# Patient Record
Sex: Male | Born: 1965 | Race: Black or African American | Hispanic: No | Marital: Married | Smoking: Never smoker
Health system: Southern US, Community
[De-identification: ages and names within clinical notes are randomized; demographics above are authoritative.]

## PROBLEM LIST (undated history)

## (undated) DIAGNOSIS — I10 Essential (primary) hypertension: Secondary | ICD-10-CM

## (undated) DIAGNOSIS — Z889 Allergy status to unspecified drugs, medicaments and biological substances status: Secondary | ICD-10-CM

## (undated) HISTORY — PX: ADENOIDECTOMY: SUR15

## (undated) HISTORY — PX: TONSILLECTOMY: SUR1361

## (undated) HISTORY — PX: HERNIA REPAIR: SHX51

---

## 1998-08-06 ENCOUNTER — Encounter: Payer: Self-pay | Admitting: Emergency Medicine

## 1998-08-06 ENCOUNTER — Emergency Department (HOSPITAL_COMMUNITY): Admission: EM | Admit: 1998-08-06 | Discharge: 1998-08-06 | Payer: Self-pay | Admitting: Emergency Medicine

## 2010-02-08 ENCOUNTER — Encounter: Admission: RE | Admit: 2010-02-08 | Discharge: 2010-02-08 | Payer: Self-pay | Admitting: Internal Medicine

## 2014-12-06 ENCOUNTER — Other Ambulatory Visit: Payer: Self-pay | Admitting: Internal Medicine

## 2014-12-06 ENCOUNTER — Ambulatory Visit
Admission: RE | Admit: 2014-12-06 | Discharge: 2014-12-06 | Disposition: A | Discharge status: Home / Self Care | Payer: 59 | Source: Ambulatory Visit | Attending: Internal Medicine | Admitting: Internal Medicine

## 2014-12-06 DIAGNOSIS — M549 Dorsalgia, unspecified: Secondary | ICD-10-CM

## 2014-12-28 ENCOUNTER — Encounter (HOSPITAL_COMMUNITY): Payer: Self-pay | Admitting: *Deleted

## 2014-12-28 ENCOUNTER — Other Ambulatory Visit: Payer: Self-pay | Admitting: Gastroenterology

## 2015-01-01 NOTE — Anesthesia Preprocedure Evaluation (Addendum)
Anesthesia Evaluation  Patient identified by MRN, date of birth, ID band Patient awake    Reviewed: Allergy & Precautions, NPO status , Patient's Chart, lab work & pertinent test results, reviewed documented beta blocker date and time   Airway Mallampati: II   Neck ROM: Full    Dental  (+) Teeth Intact, Dental Advisory Given   Pulmonary neg pulmonary ROS,  breath sounds clear to auscultation        Cardiovascular hypertension, Pt. on medications Rhythm:Regular     Neuro/Psych negative neurological ROS     GI/Hepatic Neg liver ROS,   Endo/Other  negative endocrine ROS  Renal/GU      Musculoskeletal   Abdominal (+)  Abdomen: soft.    Peds  Hematology negative hematology ROS (+)   Anesthesia Other Findings   Reproductive/Obstetrics                            Anesthesia Physical Anesthesia Plan  ASA: II  Anesthesia Plan: MAC   Post-op Pain Management:    Induction: Intravenous  Airway Management Planned:   Additional Equipment:   Intra-op Plan:   Post-operative Plan:   Informed Consent: I have reviewed the patients History and Physical, chart, labs and discussed the procedure including the risks, benefits and alternatives for the proposed anesthesia with the patient or authorized representative who has indicated his/her understanding and acceptance.     Plan Discussed with:   Anesthesia Plan Comments:         Anesthesia Quick Evaluation

## 2015-01-03 ENCOUNTER — Ambulatory Visit (HOSPITAL_COMMUNITY): Payer: 59 | Admitting: Anesthesiology

## 2015-01-03 ENCOUNTER — Encounter (HOSPITAL_COMMUNITY)
Admission: RE | Disposition: A | Discharge status: Home / Self Care | Payer: 59 | Source: Ambulatory Visit | Attending: Gastroenterology

## 2015-01-03 ENCOUNTER — Ambulatory Visit (HOSPITAL_COMMUNITY)
Admission: RE | Admit: 2015-01-03 | Discharge: 2015-01-03 | Disposition: A | Discharge status: Home / Self Care | Payer: 59 | Source: Ambulatory Visit | Attending: Gastroenterology | Admitting: Gastroenterology

## 2015-01-03 ENCOUNTER — Encounter (HOSPITAL_COMMUNITY): Payer: Self-pay | Admitting: Gastroenterology

## 2015-01-03 DIAGNOSIS — I7300 Raynaud's syndrome without gangrene: Secondary | ICD-10-CM | POA: Insufficient documentation

## 2015-01-03 DIAGNOSIS — Z1211 Encounter for screening for malignant neoplasm of colon: Secondary | ICD-10-CM | POA: Diagnosis not present

## 2015-01-03 DIAGNOSIS — K5730 Diverticulosis of large intestine without perforation or abscess without bleeding: Secondary | ICD-10-CM | POA: Diagnosis not present

## 2015-01-03 DIAGNOSIS — I10 Essential (primary) hypertension: Secondary | ICD-10-CM | POA: Diagnosis not present

## 2015-01-03 DIAGNOSIS — Z880 Allergy status to penicillin: Secondary | ICD-10-CM | POA: Insufficient documentation

## 2015-01-03 DIAGNOSIS — Z888 Allergy status to other drugs, medicaments and biological substances status: Secondary | ICD-10-CM | POA: Insufficient documentation

## 2015-01-03 DIAGNOSIS — M1990 Unspecified osteoarthritis, unspecified site: Secondary | ICD-10-CM | POA: Insufficient documentation

## 2015-01-03 HISTORY — DX: Allergy status to unspecified drugs, medicaments and biological substances: Z88.9

## 2015-01-03 HISTORY — PX: COLONOSCOPY WITH PROPOFOL: SHX5780

## 2015-01-03 HISTORY — DX: Essential (primary) hypertension: I10

## 2015-01-03 SURGERY — COLONOSCOPY WITH PROPOFOL
Anesthesia: Monitor Anesthesia Care

## 2015-01-03 MED ORDER — PROPOFOL INFUSION 10 MG/ML OPTIME
INTRAVENOUS | Status: DC | PRN
  Administered 2015-01-03: 120 ug/kg/min via INTRAVENOUS

## 2015-01-03 MED ORDER — LACTATED RINGERS IV SOLN
INTRAVENOUS | Status: DC
  Administered 2015-01-03: 1000 mL via INTRAVENOUS

## 2015-01-03 MED ORDER — PROPOFOL 10 MG/ML IV BOLUS
INTRAVENOUS | Status: DC | PRN
  Administered 2015-01-03: 50 mg via INTRAVENOUS
  Administered 2015-01-03 (×3): 20 mg via INTRAVENOUS

## 2015-01-03 MED ORDER — PROPOFOL INFUSION 10 MG/ML OPTIME: INTRAVENOUS | Status: DC | PRN

## 2015-01-03 MED ORDER — PROPOFOL 10 MG/ML IV BOLUS
INTRAVENOUS | Status: AC
  Filled 2015-01-03: qty 20

## 2015-01-03 MED ORDER — PROPOFOL 10 MG/ML IV BOLUS: INTRAVENOUS | Status: DC | PRN

## 2015-01-03 MED ORDER — PROMETHAZINE HCL 25 MG/ML IJ SOLN: 6.2500 mg | INTRAMUSCULAR | Status: DC | PRN

## 2015-01-03 MED ORDER — SODIUM CHLORIDE 0.9 % IV SOLN: INTRAVENOUS | Status: DC

## 2015-01-03 MED ORDER — MEPERIDINE HCL 100 MG/ML IJ SOLN: 6.2500 mg | INTRAMUSCULAR | Status: DC | PRN

## 2015-01-03 SURGICAL SUPPLY — 22 items

## 2015-01-03 NOTE — Discharge Instructions (Signed)
Colonoscopy, Care After °Refer to this sheet in the next few weeks. These instructions provide you with information on caring for yourself after your procedure. Your health care provider may also give you more specific instructions. Your treatment has been planned according to current medical practices, but problems sometimes occur. Call your health care provider if you have any problems or questions after your procedure. °WHAT TO EXPECT AFTER THE PROCEDURE  °After your procedure, it is typical to have the following: °· A small amount of blood in your stool. °· Moderate amounts of gas and mild abdominal cramping or bloating. °HOME CARE INSTRUCTIONS °· Do not drive, operate machinery, or sign important documents for 24 hours. °· You may shower and resume your regular physical activities, but move at a slower pace for the first 24 hours. °· Take frequent rest periods for the first 24 hours. °· Walk around or put a warm pack on your abdomen to help reduce abdominal cramping and bloating. °· Drink enough fluids to keep your urine clear or pale yellow. °· You may resume your normal diet as instructed by your health care provider. Avoid heavy or fried foods that are hard to digest. °· Avoid drinking alcohol for 24 hours or as instructed by your health care provider. °· Only take over-the-counter or prescription medicines as directed by your health care provider. °· If a tissue sample (biopsy) was taken during your procedure: °¨ Do not take aspirin or blood thinners for 7 days, or as instructed by your health care provider. °¨ Do not drink alcohol for 7 days, or as instructed by your health care provider. °¨ Eat soft foods for the first 24 hours. °SEEK MEDICAL CARE IF: °You have persistent spotting of blood in your stool 2-3 days after the procedure. °SEEK IMMEDIATE MEDICAL CARE IF: °· You have more than a small spotting of blood in your stool. °· You pass large blood clots in your stool. °· Your abdomen is swollen  (distended). °· You have nausea or vomiting. °· You have a fever. °· You have increasing abdominal pain that is not relieved with medicine. °Document Released: 03/13/2004 Document Revised: 05/20/2013 Document Reviewed: 04/06/2013 °[employer]® Patient Information ©2015 [employer]. This information is not intended to replace advice given to you by your health care provider. Make sure you discuss any questions you have with your health care provider. ° °Conscious Sedation, Adult, Care After °Refer to this sheet in the next few weeks. These instructions provide you with information on caring for yourself after your procedure. Your health care provider may also give you more specific instructions. Your treatment has been planned according to current medical practices, but problems sometimes occur. Call your health care provider if you have any problems or questions after your procedure. °WHAT TO EXPECT AFTER THE PROCEDURE  °After your procedure: °· You may feel sleepy, clumsy, and have poor balance for several hours. °· Vomiting may occur if you eat too soon after the procedure. °HOME CARE INSTRUCTIONS °· Do not participate in any activities where you could become injured for at least 24 hours. Do not: °¨ Drive. °¨ Swim. °¨ Ride a bicycle. °¨ Operate heavy machinery. °¨ Cook. °¨ Use power tools. °¨ Climb ladders. °¨ Work from a high place. °· Do not make important decisions or sign legal documents until you are improved. °· If you vomit, drink water, juice, or soup when you can drink without vomiting. Make sure you have little or no nausea before eating solid foods. °·   Only take over-the-counter or prescription medicines for pain, discomfort, or fever as directed by your health care provider. °· Make sure you and your family fully understand everything about the medicines given to you, including what side effects may occur. °· You should not drink alcohol, take sleeping pills, or take medicines that cause drowsiness for  at least 24 hours. °· If you smoke, do not smoke without supervision. °· If you are feeling better, you may resume normal activities 24 hours after you were sedated. °· Keep all appointments with your health care provider. °SEEK MEDICAL CARE IF: °· Your skin is pale or bluish in color. °· You continue to feel nauseous or vomit. °· Your pain is getting worse and is not helped by medicine. °· You have bleeding or swelling. °· You are still sleepy or feeling clumsy after 24 hours. °SEEK IMMEDIATE MEDICAL CARE IF: °· You develop a rash. °· You have difficulty breathing. °· You develop any type of allergic problem. °· You have a fever. °MAKE SURE YOU: °· Understand these instructions. °· Will watch your condition. °· Will get help right away if you are not doing well or get worse. °Document Released: 05/20/2013 Document Reviewed: 05/20/2013 °Exit[employer]® Patient Information ©2015 [employer]. This information is not intended to replace advice given to you by your health care provider. Make sure you discuss any questions you have with your health care provider. ° ° °

## 2015-01-03 NOTE — Transfer of Care (Signed)
Immediate Anesthesia Transfer of Care Note  Patient: Steven Benson  Procedure(s) Performed: Procedure(s): COLONOSCOPY WITH PROPOFOL (N/A)  Patient Location: PACU  Anesthesia Type:MAC  Level of Consciousness:  sedated, patient cooperative and responds to stimulation  Airway & Oxygen Therapy:Patient Spontanous Breathing and Patient connected to face mask oxgen  Post-op Assessment:  Report given to PACU RN and Post -op Vital signs reviewed and stable  Post vital signs:  Reviewed and stable  Last Vitals:  Filed Vitals:   01/03/15 1239  BP: 131/76  Pulse: 44  Temp: 36.5 C  Resp: 19    Complications: No apparent anesthesia complications

## 2015-01-03 NOTE — Anesthesia Postprocedure Evaluation (Signed)
  Anesthesia Post-op Note  Patient: Steven Benson  Procedure(s) Performed: Procedure(s): COLONOSCOPY WITH PROPOFOL (N/A)  Patient Location: PACU  Anesthesia Type:MAC  Level of Consciousness: awake  Airway and Oxygen Therapy: Patient Spontanous Breathing  Post-op Pain: none  Post-op Assessment: Post-op Vital signs reviewed, Patient's Cardiovascular Status Stable, Respiratory Function Stable, Patent Airway and No signs of Nausea or vomiting  Post-op Vital Signs: Reviewed and stable  Last Vitals:  Filed Vitals:   01/03/15 1442  BP: 114/62  Pulse: 46  Temp: 36.4 C  Resp: 15    Complications: No apparent anesthesia complications

## 2015-01-03 NOTE — Op Note (Signed)
Procedure: Baseline screening colonoscopy  Endoscopist: Danise EdgeMartin Johnson  Premedication: Propofol administered by anesthesia  Procedure: The patient was placed in the left lateral decubitus position. Anal inspection and digital rectal exam were normal. The Pentax pediatric colonoscope was introduced into the rectum and advanced to the cecum. A normal-appearing appendiceal orifice and ileocecal valve were identified. Colonic preparation for the exam today was good. Withdrawal time was 9 minutes  Rectum. Normal. Retroflexed view of the distal rectum was normal  Sigmoid colon and descending colon. Left colonic diverticulosis  Splenic flexure. Normal  Transverse colon. Normal  Hepatic flexure. Normal.  Ascending colon. Normal  Cecum and ileocecal valve. Normal  Assessment: Normal screening colonoscopy  Recommendation: Schedule repeat screening colonoscopy in 10 years

## 2015-01-03 NOTE — H&P (Signed)
  Procedure: Baseline screening colonoscopy. No family history of colon cancer  History: The patient is a 49 year old male born 1965-10-02. He is scheduled to undergo his first screening colonoscopy with polypectomy to prevent colon cancer.  Past medical history: Hypertension. Allergic rhinitis. Osteoarthritis. Raynaud's disease. Left inguinal herniorrhaphy surgery. Tonsillectomy. Left toe surgery.  Medication allergies: Penicillin. ACE inhibitors. Quinapril.  Exam: The patient is alert and lying comfortably on the endoscopy stretcher. Abdomen is soft and nontender to palpation. Lungs are clear to auscultation. Cardiac exam reveals a regular rhythm.  Plan: Proceed with screening colonoscopy

## 2015-01-04 ENCOUNTER — Encounter (HOSPITAL_COMMUNITY): Payer: Self-pay | Admitting: Gastroenterology

## 2016-07-08 IMAGING — CR DG CHEST 2V
2 series · 2 of 2 positions shown · non-contrast
Comparison: None.

CLINICAL DATA: Mid back pain for 1 month, no smoking history

EXAM:
CHEST  2 VIEW

[w chest pa]
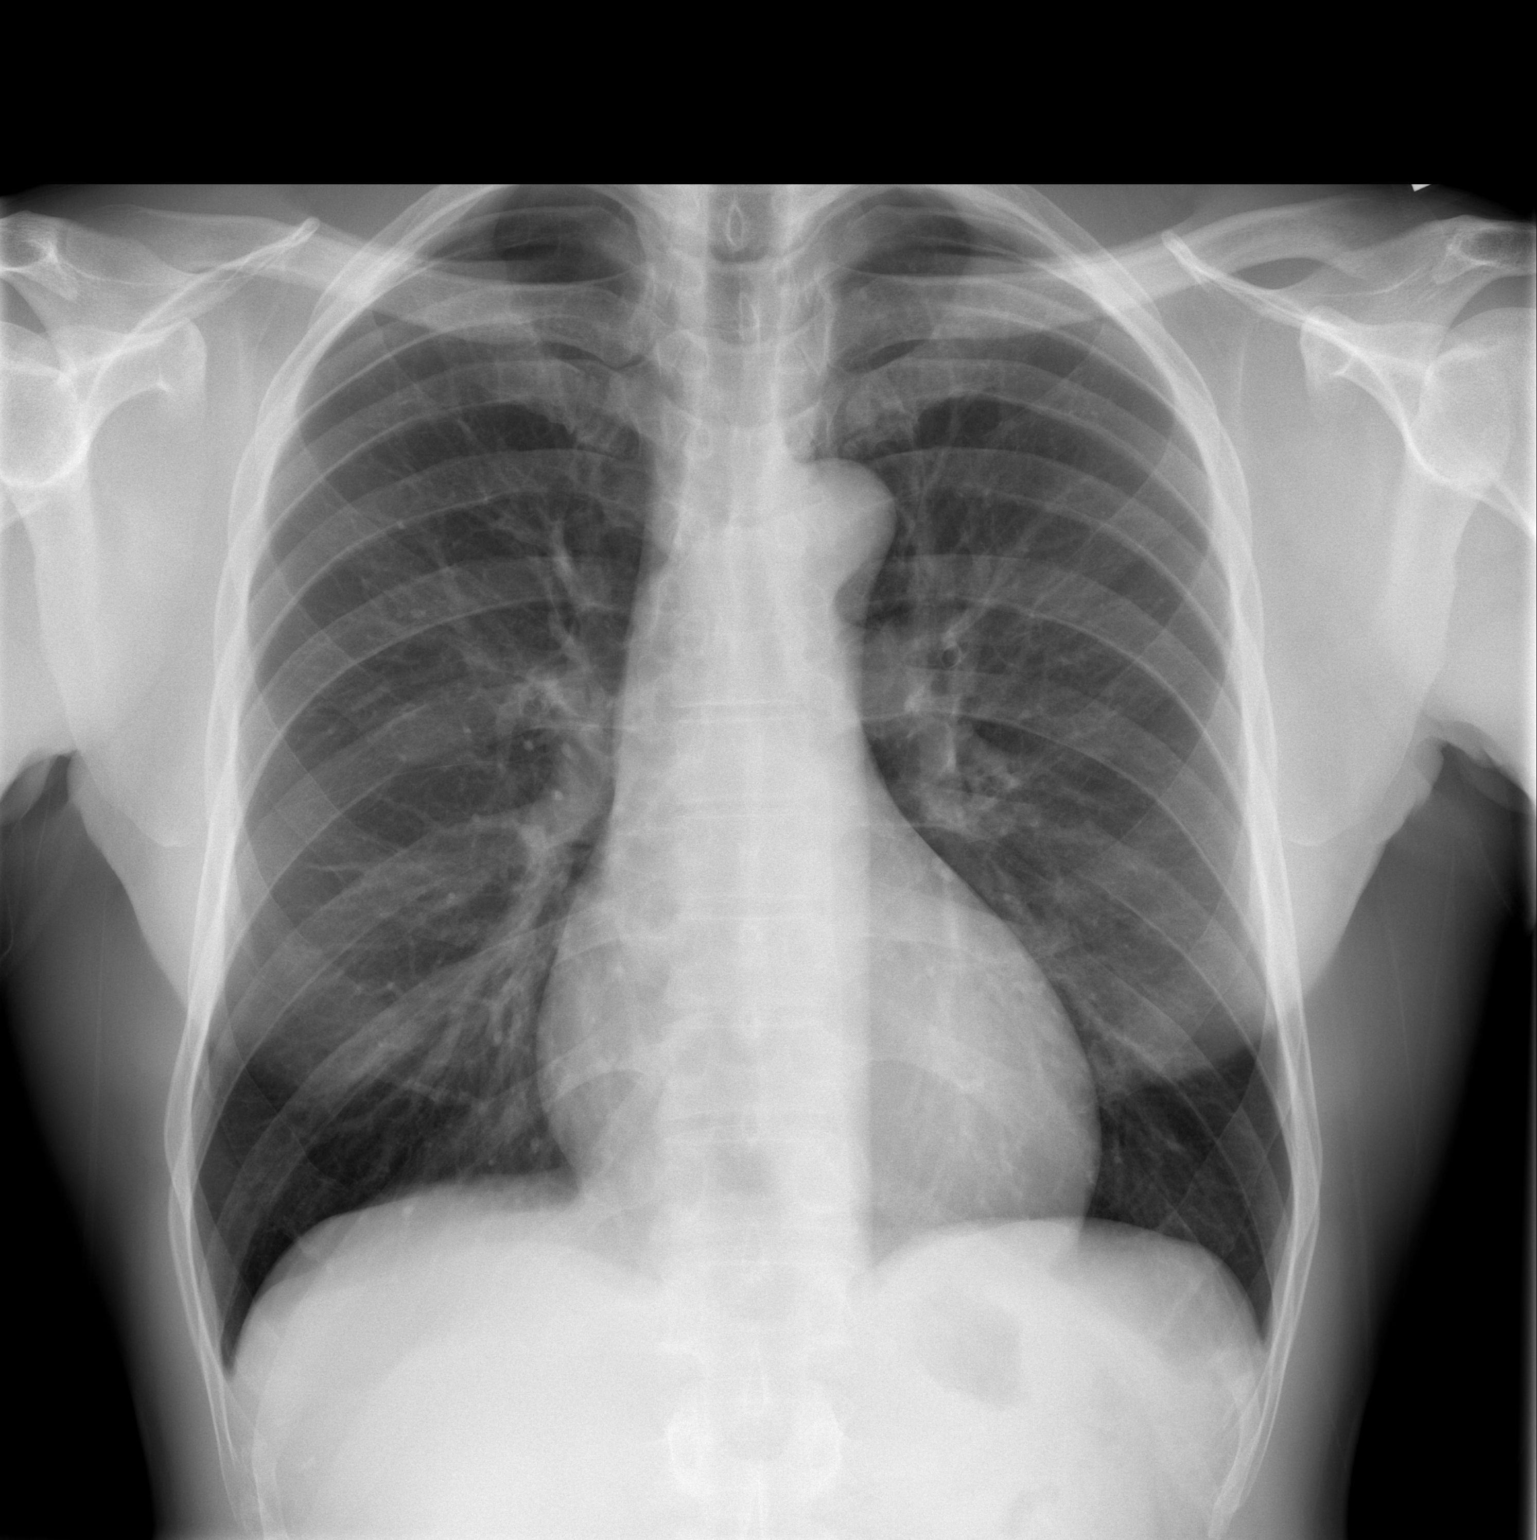

[w chest lat]
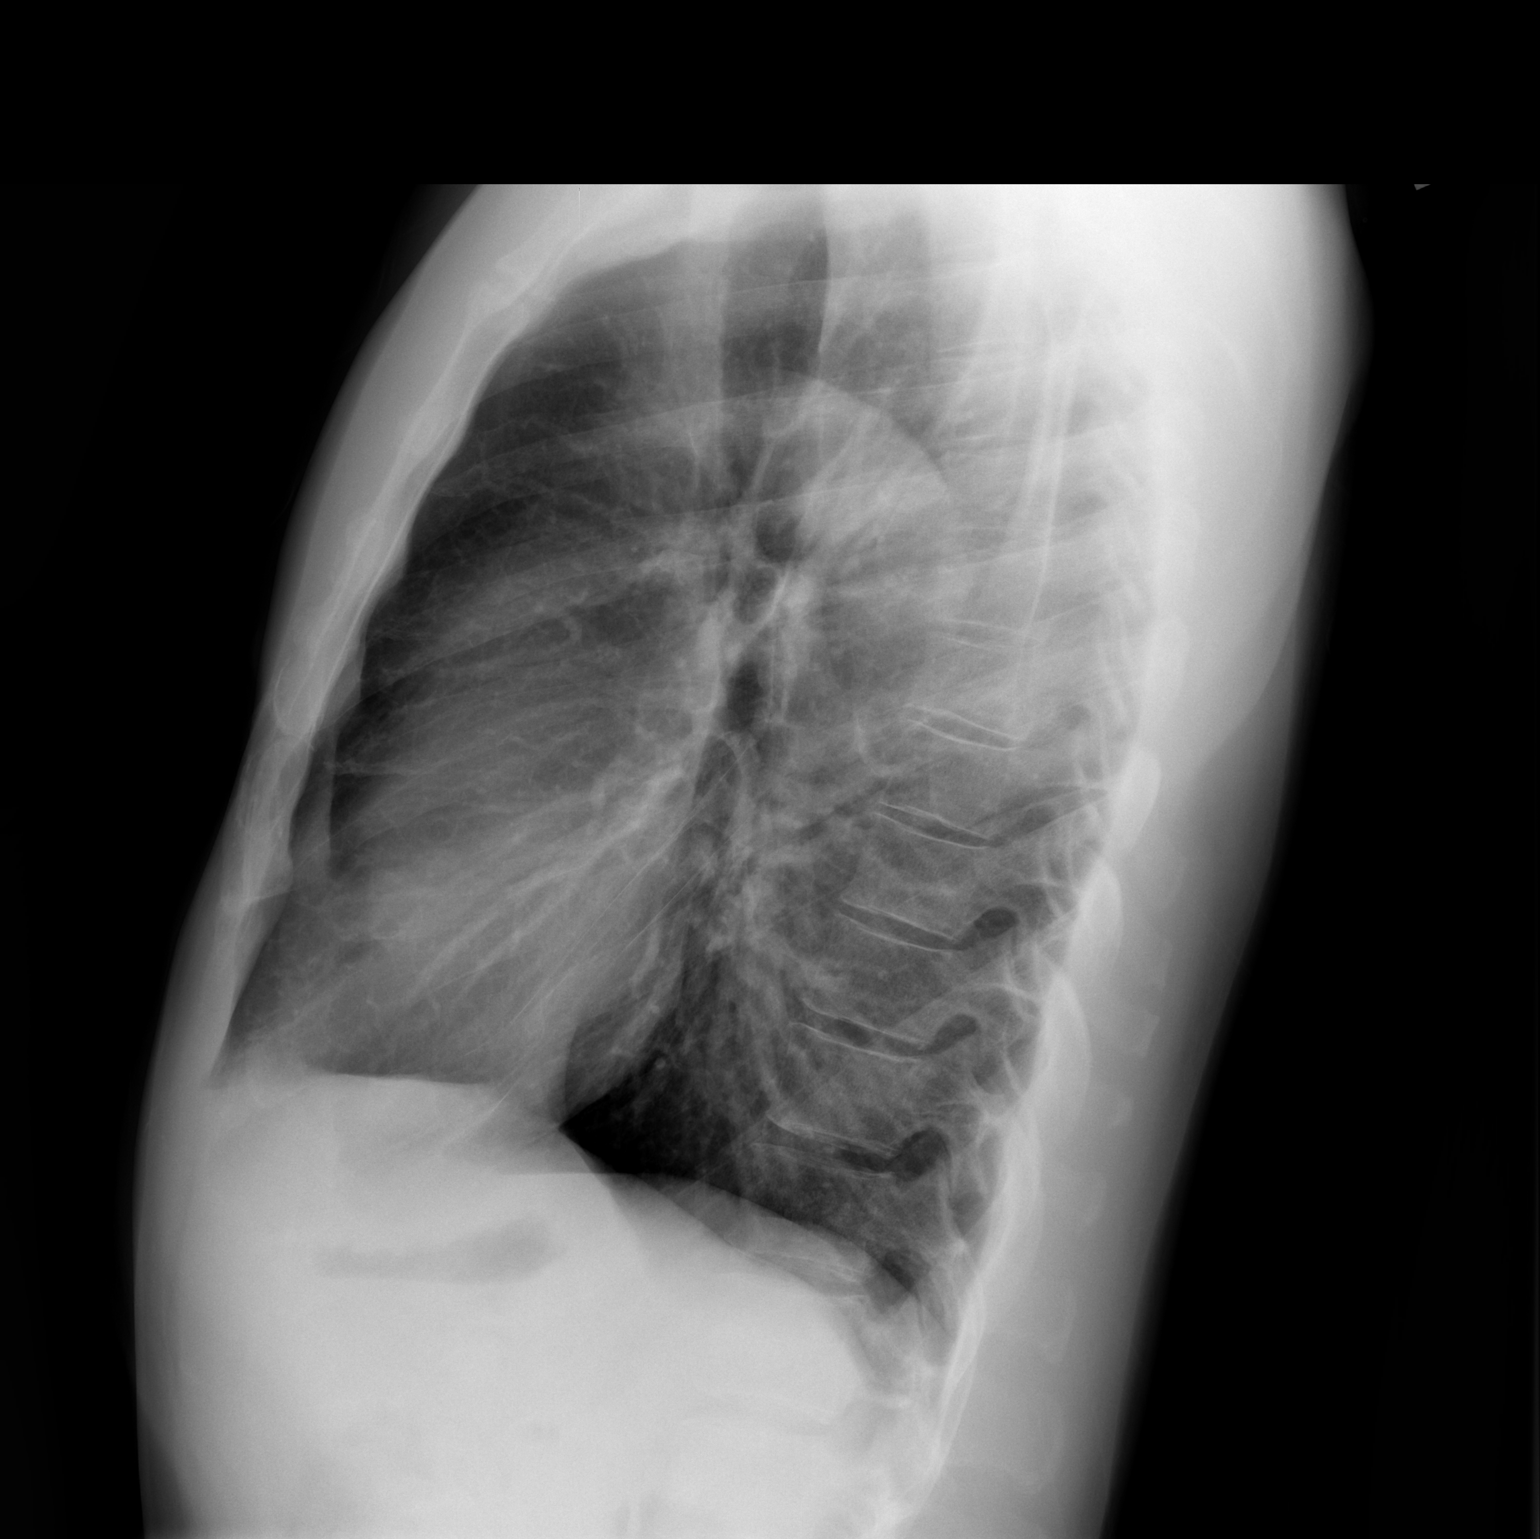

[2 of 2 positions shown; findings below may reference images not displayed]

FINDINGS: The heart size and mediastinal contours are within normal limits.
Both lungs are clear. The visualized skeletal structures are
unremarkable.
IMPRESSION: No active cardiopulmonary disease.

## 2016-10-29 ENCOUNTER — Emergency Department (HOSPITAL_COMMUNITY)
Admission: EM | Admit: 2016-10-29 | Discharge: 2016-10-29 | Disposition: A | Discharge status: Home / Self Care | Payer: 59 | Attending: Emergency Medicine | Admitting: Emergency Medicine

## 2016-10-29 ENCOUNTER — Encounter (HOSPITAL_COMMUNITY): Payer: Self-pay | Admitting: Emergency Medicine

## 2016-10-29 DIAGNOSIS — R42 Dizziness and giddiness: Secondary | ICD-10-CM | POA: Diagnosis present

## 2016-10-29 DIAGNOSIS — R531 Weakness: Secondary | ICD-10-CM | POA: Diagnosis not present

## 2016-10-29 DIAGNOSIS — Z79899 Other long term (current) drug therapy: Secondary | ICD-10-CM | POA: Diagnosis not present

## 2016-10-29 DIAGNOSIS — I10 Essential (primary) hypertension: Secondary | ICD-10-CM | POA: Insufficient documentation

## 2016-10-29 DIAGNOSIS — H81392 Other peripheral vertigo, left ear: Secondary | ICD-10-CM | POA: Diagnosis not present

## 2016-10-29 DIAGNOSIS — Z91040 Latex allergy status: Secondary | ICD-10-CM | POA: Insufficient documentation

## 2016-10-29 DIAGNOSIS — H6122 Impacted cerumen, left ear: Secondary | ICD-10-CM | POA: Diagnosis not present

## 2016-10-29 DIAGNOSIS — R404 Transient alteration of awareness: Secondary | ICD-10-CM | POA: Diagnosis not present

## 2016-10-29 MED ORDER — DOCUSATE SODIUM 50 MG/5ML PO LIQD
50.0000 mg | Freq: Once | ORAL | Status: AC
  Administered 2016-10-29: 50 mg via OTIC
  Filled 2016-10-29: qty 10

## 2016-10-29 MED ORDER — SODIUM CHLORIDE 0.9 % IV BOLUS (SEPSIS)
1000.0000 mL | Freq: Once | INTRAVENOUS | Status: AC
  Administered 2016-10-29: 1000 mL via INTRAVENOUS

## 2016-10-29 MED ORDER — MECLIZINE HCL 25 MG PO TABS
25.0000 mg | ORAL_TABLET | Freq: Once | ORAL | Status: AC
  Administered 2016-10-29: 25 mg via ORAL
  Filled 2016-10-29: qty 1

## 2016-10-29 MED ORDER — DIPHENHYDRAMINE HCL 50 MG/ML IJ SOLN
25.0000 mg | Freq: Once | INTRAMUSCULAR | Status: AC
  Administered 2016-10-29: 25 mg via INTRAVENOUS
  Filled 2016-10-29: qty 1

## 2016-10-29 MED ORDER — PROCHLORPERAZINE EDISYLATE 5 MG/ML IJ SOLN
10.0000 mg | Freq: Once | INTRAMUSCULAR | Status: AC
  Administered 2016-10-29: 10 mg via INTRAVENOUS
  Filled 2016-10-29: qty 2

## 2016-10-29 MED ORDER — MECLIZINE HCL 25 MG PO TABS
25.0000 mg | ORAL_TABLET | Freq: Three times a day (TID) | ORAL | 0 refills | Status: DC | PRN
Start: 2016-10-29 — End: 2020-10-11

## 2016-10-29 NOTE — ED Notes (Signed)
Large amount of wax irrigated from left ear. PT tolerated well. PT tolerates sitting up in bed and reports nausea has improved. PT continues to report dizziness, but it has also improved.

## 2016-10-29 NOTE — ED Notes (Signed)
ED Provider at bedside. 

## 2016-10-29 NOTE — ED Notes (Signed)
PT ambulates to bathroom while holding wall rail. PT's wife reports this is an improvement as PT was not able to stand at home.

## 2016-10-29 NOTE — ED Provider Notes (Signed)
[facility]-EMERGENCY DEPT Provider Note   CSN: [id number] Arrival date & time: 10/29/16  2012     History   Chief Complaint Chief Complaint  Patient presents with  . Dizziness  . Near Syncope    HPI Steven Benson is a 51 y.o. male.  51 yo M with a chief complaint of vertigo. This been going on since morning. Worsening with head movement right movement. Patient got so bad today that he started throwing up. Denies chest pain shortness breath denies headaches. At some diaphoresis with his vomiting. Denies abdominal pain denies fevers   The history is provided by the patient.  Dizziness  Quality:  Head spinning Severity:  Severe Onset quality:  Sudden Duration:  1 day Timing:  Constant Progression:  Unchanged Chronicity:  New Context: eye movement and head movement   Relieved by:  Being still and closing eyes Worsened by:  Eye movement and turning head Ineffective treatments:  None tried Associated symptoms: no chest pain, no diarrhea, no headaches, no palpitations, no shortness of breath and no vomiting   Near Syncope  Pertinent negatives include no chest pain, no abdominal pain, no headaches and no shortness of breath.    Past Medical History:  Diagnosis Date  . H/O seasonal allergies   . Hypertension     There are no active problems to display for this patient.   Past Surgical History:  Procedure Laterality Date  . COLONOSCOPY WITH PROPOFOL N/A 01/03/2015   Procedure: COLONOSCOPY WITH PROPOFOL;  Surgeon: Charolett Bumpers, MD;  Location: [facility];  Service: Endoscopy;  Laterality: N/A;  . HERNIA REPAIR     inguinal hernia 10 yrs ago  . TONSILLECTOMY         Home Medications    Prior to Admission medications   Medication Sig Start Date End Date Taking? Authorizing Provider  fluticasone (FLONASE) 50 MCG/ACT nasal spray Place 1-2 sprays into both nostrils daily as needed for allergies or rhinitis.   Yes Historical Provider, MD  hydrochlorothiazide  (HYDRODIURIL) 12.5 MG tablet Take 12.5 mg by mouth every morning.    Yes Historical Provider, MD  ibuprofen (ADVIL,MOTRIN) 200 MG tablet Take 200 mg by mouth every 4 (four) hours as needed for headache or moderate pain.   Yes Historical Provider, MD  meclizine (ANTIVERT) 25 MG tablet Take 1 tablet (25 mg total) by mouth 3 (three) times daily as needed for dizziness. 10/29/16   Melene Plan, DO    Family History No family history on file.  Social History Social History  Substance Use Topics  . Smoking status: Never Smoker  . Smokeless tobacco: Never Used  . Alcohol use Yes     Comment: 6 per week     Allergies   Accupril [quinapril hcl]; Latex; and Tape   Review of Systems Review of Systems  Constitutional: Negative for chills and fever.  HENT: Negative for congestion and facial swelling.   Eyes: Negative for discharge and visual disturbance.  Respiratory: Negative for shortness of breath.   Cardiovascular: Positive for near-syncope. Negative for chest pain and palpitations.  Gastrointestinal: Negative for abdominal pain, diarrhea and vomiting.  Musculoskeletal: Negative for arthralgias and myalgias.  Skin: Negative for color change and rash.  Neurological: Positive for dizziness. Negative for tremors, syncope and headaches.  Psychiatric/Behavioral: Negative for confusion and dysphoric mood.     Physical Exam Updated Vital Signs BP 128/86   Pulse (!) 58   Temp 97.8 F (36.6 C) (Oral)   Resp 17  Ht 6\' 1"  (1.854 m)   Wt 195 lb (88.5 kg)   SpO2 98%   BMI 25.73 kg/m   Physical Exam  Constitutional: He is oriented to person, place, and time. He appears well-developed and well-nourished.  HENT:  Head: Normocephalic and atraumatic.  Ears:  Eyes: EOM are normal. Pupils are equal, round, and reactive to light.  Neck: Normal range of motion. Neck supple. No JVD present.  Cardiovascular: Normal rate and regular rhythm.  Exam reveals no gallop and no friction rub.   No  murmur heard. Pulmonary/Chest: No respiratory distress. He has no wheezes.  Abdominal: He exhibits no distension and no mass. There is no tenderness. There is no rebound and no guarding.  Musculoskeletal: Normal range of motion.  Neurological: He is alert and oriented to person, place, and time. He has normal strength. No cranial nerve deficit or sensory deficit. Coordination normal. GCS eye subscore is 4. GCS verbal subscore is 5. GCS motor subscore is 6.  Left-sided fast going nystagmus.  Skin: No rash noted. No pallor.  Psychiatric: He has a normal mood and affect. His behavior is normal.  Nursing note and vitals reviewed.    ED Treatments / Results  Labs (all labs ordered are listed, but only abnormal results are displayed) Labs Reviewed - No data to display  EKG  EKG Interpretation  Date/Time:  Monday October 29 2016 20:12:26 EDT Ventricular Rate:  48 PR Interval:    QRS Duration: 125 QT Interval:  455 QTC Calculation: 407 R Axis:   -56 Text Interpretation:  Sinus bradycardia Prolonged PR interval RBBB and LAFB Probable left ventricular hypertrophy Borderline ST elevation, lateral leads No old tracing to compare Confirmed by FLOYD MD, DANIEL [phone]) on 10/29/2016 8:28:36 PM       Radiology No results found.  Procedures Procedures (including critical care time)  Medications Ordered in ED Medications  prochlorperazine (COMPAZINE) injection 10 mg (10 mg Intravenous Given 10/29/16 2038)  diphenhydrAMINE (BENADRYL) injection 25 mg (25 mg Intravenous Given 10/29/16 2036)  sodium chloride 0.9 % bolus 1,000 mL (0 mLs Intravenous Stopped 10/29/16 2116)  meclizine (ANTIVERT) tablet 25 mg (25 mg Oral Given 10/29/16 2117)  docusate (COLACE) 50 MG/5ML liquid 50 mg (50 mg Both Ears Given 10/29/16 2144)     Initial Impression / Assessment and Plan / ED Course  I have reviewed the triage vital signs and the nursing notes.  Pertinent labs & imaging results that were available during my  care of the patient were reviewed by me and considered in my medical decision making (see chart for details).     51 yo M With a chief complaint of peripheral vertigo. This seems to be isolated to the left ear. Patient was wearing earplugs earlier today and has what looks like impacted wax in the left TM. I feel like this is the cause of symptoms or he has BPPV. Will irrigate the ear give Compazine and Benadryl fluids and meclizine.  Patient feeling better, still mildly dizzy, will have the patient follow up with PCP.   11:14 PM:  I have discussed the diagnosis/risks/treatment options with the patient and family and believe the pt to be eligible for discharge home to follow-up with PCP. We also discussed returning to the ED immediately if new or worsening sx occur. We discussed the sx which are most concerning (e.g., sudden worsening pain, fever, inability to tolerate by mouth) that necessitate immediate return. Medications administered to the patient during their visit and any new  prescriptions provided to the patient are listed below.  Medications given during this visit Medications  prochlorperazine (COMPAZINE) injection 10 mg (10 mg Intravenous Given 10/29/16 2038)  diphenhydrAMINE (BENADRYL) injection 25 mg (25 mg Intravenous Given 10/29/16 2036)  sodium chloride 0.9 % bolus 1,000 mL (0 mLs Intravenous Stopped 10/29/16 2116)  meclizine (ANTIVERT) tablet 25 mg (25 mg Oral Given 10/29/16 2117)  docusate (COLACE) 50 MG/5ML liquid 50 mg (50 mg Both Ears Given 10/29/16 2144)     The patient appears reasonably screen and/or stabilized for discharge and I doubt any other medical condition or other [facility] requiring further screening, evaluation, or treatment in the ED at this time prior to discharge.    Final Clinical Impressions(s) / ED Diagnoses   Final diagnoses:  Peripheral vertigo involving left ear  Impacted cerumen of left ear    New Prescriptions New Prescriptions   MECLIZINE (ANTIVERT)  25 MG TABLET    Take 1 tablet (25 mg total) by mouth 3 (three) times daily as needed for dizziness.     Melene Planan Floyd, DO 10/29/16 2314

## 2016-10-29 NOTE — ED Notes (Signed)
Dr. Adela LankFloyd informed that pt is a runner. Pt's pulse and HR are generally in the 50's.

## 2016-10-29 NOTE — ED Triage Notes (Signed)
PT reports dizzy episode for five minutes at 0900. PT reports he was in the grocery store at 1830, became very weak and dizzy, left his groceries and drove home. Upon arrival home, PT became sweaty and walked to front door. PT was nauseated. PT's wife assisted him to bathroom where he continued sweat and drive heaved. PT reports near syncopal episode. PT reports room is spinning. PT denies chest pain and SOB throughout episode. PT still feels dizzy and slightly nauseated. PT has 4mg  zofran in route. PT was negative for ortho static BP. PT is a runner and resting HR is always bradycardic.

## 2016-10-29 NOTE — ED Notes (Signed)
Care handoff to Bobby RN 

## 2016-11-01 DIAGNOSIS — R42 Dizziness and giddiness: Secondary | ICD-10-CM | POA: Diagnosis not present

## 2016-12-03 DIAGNOSIS — I7300 Raynaud's syndrome without gangrene: Secondary | ICD-10-CM | POA: Diagnosis not present

## 2016-12-03 DIAGNOSIS — Z125 Encounter for screening for malignant neoplasm of prostate: Secondary | ICD-10-CM | POA: Diagnosis not present

## 2016-12-03 DIAGNOSIS — I10 Essential (primary) hypertension: Secondary | ICD-10-CM | POA: Diagnosis not present

## 2016-12-03 DIAGNOSIS — Z0000 Encounter for general adult medical examination without abnormal findings: Secondary | ICD-10-CM | POA: Diagnosis not present

## 2016-12-03 DIAGNOSIS — Z1389 Encounter for screening for other disorder: Secondary | ICD-10-CM | POA: Diagnosis not present

## 2016-12-03 DIAGNOSIS — E7800 Pure hypercholesterolemia, unspecified: Secondary | ICD-10-CM | POA: Diagnosis not present

## 2016-12-03 DIAGNOSIS — M1990 Unspecified osteoarthritis, unspecified site: Secondary | ICD-10-CM | POA: Diagnosis not present

## 2017-02-15 DIAGNOSIS — I10 Essential (primary) hypertension: Secondary | ICD-10-CM | POA: Diagnosis not present

## 2017-03-07 DIAGNOSIS — M19072 Primary osteoarthritis, left ankle and foot: Secondary | ICD-10-CM | POA: Diagnosis not present

## 2017-03-14 DIAGNOSIS — I444 Left anterior fascicular block: Secondary | ICD-10-CM | POA: Diagnosis not present

## 2017-03-14 DIAGNOSIS — R42 Dizziness and giddiness: Secondary | ICD-10-CM | POA: Diagnosis not present

## 2017-03-14 DIAGNOSIS — R001 Bradycardia, unspecified: Secondary | ICD-10-CM | POA: Diagnosis not present

## 2017-03-14 DIAGNOSIS — I440 Atrioventricular block, first degree: Secondary | ICD-10-CM | POA: Diagnosis not present

## 2017-04-05 DIAGNOSIS — R42 Dizziness and giddiness: Secondary | ICD-10-CM | POA: Diagnosis not present

## 2017-04-11 DIAGNOSIS — L720 Epidermal cyst: Secondary | ICD-10-CM | POA: Diagnosis not present

## 2017-05-10 DIAGNOSIS — R42 Dizziness and giddiness: Secondary | ICD-10-CM | POA: Diagnosis not present

## 2017-05-24 DIAGNOSIS — I10 Essential (primary) hypertension: Secondary | ICD-10-CM | POA: Diagnosis not present

## 2017-05-24 DIAGNOSIS — H8110 Benign paroxysmal vertigo, unspecified ear: Secondary | ICD-10-CM | POA: Diagnosis not present

## 2017-05-24 DIAGNOSIS — J309 Allergic rhinitis, unspecified: Secondary | ICD-10-CM | POA: Diagnosis not present

## 2017-06-07 DIAGNOSIS — R42 Dizziness and giddiness: Secondary | ICD-10-CM | POA: Diagnosis not present

## 2017-06-07 DIAGNOSIS — M542 Cervicalgia: Secondary | ICD-10-CM | POA: Diagnosis not present

## 2017-06-07 DIAGNOSIS — H6123 Impacted cerumen, bilateral: Secondary | ICD-10-CM | POA: Diagnosis not present

## 2017-07-26 DIAGNOSIS — H6121 Impacted cerumen, right ear: Secondary | ICD-10-CM | POA: Diagnosis not present

## 2017-07-29 DIAGNOSIS — M19072 Primary osteoarthritis, left ankle and foot: Secondary | ICD-10-CM | POA: Diagnosis not present

## 2017-07-29 DIAGNOSIS — M2022 Hallux rigidus, left foot: Secondary | ICD-10-CM | POA: Diagnosis not present

## 2017-09-05 DIAGNOSIS — L731 Pseudofolliculitis barbae: Secondary | ICD-10-CM | POA: Diagnosis not present

## 2017-09-05 DIAGNOSIS — K649 Unspecified hemorrhoids: Secondary | ICD-10-CM | POA: Diagnosis not present

## 2017-09-05 DIAGNOSIS — J0191 Acute recurrent sinusitis, unspecified: Secondary | ICD-10-CM | POA: Diagnosis not present

## 2017-10-03 DIAGNOSIS — R9389 Abnormal findings on diagnostic imaging of other specified body structures: Secondary | ICD-10-CM | POA: Diagnosis not present

## 2017-10-03 DIAGNOSIS — J320 Chronic maxillary sinusitis: Secondary | ICD-10-CM | POA: Diagnosis not present

## 2017-10-03 DIAGNOSIS — J342 Deviated nasal septum: Secondary | ICD-10-CM | POA: Diagnosis not present

## 2017-11-01 DIAGNOSIS — H903 Sensorineural hearing loss, bilateral: Secondary | ICD-10-CM | POA: Diagnosis not present

## 2017-11-01 DIAGNOSIS — H9312 Tinnitus, left ear: Secondary | ICD-10-CM | POA: Diagnosis not present

## 2017-11-29 DIAGNOSIS — M79672 Pain in left foot: Secondary | ICD-10-CM | POA: Diagnosis not present

## 2017-11-29 DIAGNOSIS — R2689 Other abnormalities of gait and mobility: Secondary | ICD-10-CM | POA: Diagnosis not present

## 2017-11-29 DIAGNOSIS — M2022 Hallux rigidus, left foot: Secondary | ICD-10-CM | POA: Diagnosis not present

## 2017-12-20 DIAGNOSIS — E7800 Pure hypercholesterolemia, unspecified: Secondary | ICD-10-CM | POA: Diagnosis not present

## 2017-12-20 DIAGNOSIS — Z125 Encounter for screening for malignant neoplasm of prostate: Secondary | ICD-10-CM | POA: Diagnosis not present

## 2017-12-20 DIAGNOSIS — Z0000 Encounter for general adult medical examination without abnormal findings: Secondary | ICD-10-CM | POA: Diagnosis not present

## 2018-03-04 DIAGNOSIS — Z0189 Encounter for other specified special examinations: Secondary | ICD-10-CM | POA: Diagnosis not present

## 2018-03-04 DIAGNOSIS — L259 Unspecified contact dermatitis, unspecified cause: Secondary | ICD-10-CM | POA: Diagnosis not present

## 2018-03-04 DIAGNOSIS — K649 Unspecified hemorrhoids: Secondary | ICD-10-CM | POA: Diagnosis not present

## 2018-04-25 DIAGNOSIS — I10 Essential (primary) hypertension: Secondary | ICD-10-CM | POA: Diagnosis not present

## 2018-04-25 DIAGNOSIS — H25813 Combined forms of age-related cataract, bilateral: Secondary | ICD-10-CM | POA: Diagnosis not present

## 2018-05-23 DIAGNOSIS — Z23 Encounter for immunization: Secondary | ICD-10-CM | POA: Diagnosis not present

## 2018-06-27 DIAGNOSIS — J309 Allergic rhinitis, unspecified: Secondary | ICD-10-CM | POA: Diagnosis not present

## 2018-06-27 DIAGNOSIS — I10 Essential (primary) hypertension: Secondary | ICD-10-CM | POA: Diagnosis not present

## 2018-06-27 DIAGNOSIS — D709 Neutropenia, unspecified: Secondary | ICD-10-CM | POA: Diagnosis not present

## 2018-08-15 DIAGNOSIS — N529 Male erectile dysfunction, unspecified: Secondary | ICD-10-CM | POA: Diagnosis not present

## 2018-09-19 DIAGNOSIS — N529 Male erectile dysfunction, unspecified: Secondary | ICD-10-CM | POA: Diagnosis not present

## 2018-12-26 DIAGNOSIS — Z0000 Encounter for general adult medical examination without abnormal findings: Secondary | ICD-10-CM | POA: Diagnosis not present

## 2020-06-06 ENCOUNTER — Ambulatory Visit: Payer: Self-pay | Admitting: Allergy

## 2020-07-27 ENCOUNTER — Ambulatory Visit: Payer: Self-pay | Admitting: Allergy

## 2020-09-01 ENCOUNTER — Ambulatory Visit: Payer: Self-pay | Admitting: Allergy & Immunology

## 2020-10-11 ENCOUNTER — Other Ambulatory Visit: Payer: Self-pay

## 2020-10-11 ENCOUNTER — Encounter: Payer: Self-pay | Admitting: Allergy & Immunology

## 2020-10-11 ENCOUNTER — Ambulatory Visit (INDEPENDENT_AMBULATORY_CARE_PROVIDER_SITE_OTHER): Payer: No Typology Code available for payment source | Admitting: Allergy & Immunology

## 2020-10-11 VITALS — BP 124/66 | HR 70 | Temp 97.9°F | Resp 18 | Ht 73.0 in | Wt 212.4 lb

## 2020-10-11 DIAGNOSIS — J3089 Other allergic rhinitis: Secondary | ICD-10-CM | POA: Diagnosis not present

## 2020-10-11 DIAGNOSIS — J342 Deviated nasal septum: Secondary | ICD-10-CM

## 2020-10-11 DIAGNOSIS — J302 Other seasonal allergic rhinitis: Secondary | ICD-10-CM | POA: Diagnosis not present

## 2020-10-11 MED ORDER — EPINEPHRINE 0.3 MG/0.3ML IJ SOAJ: 0.3000 mg | Freq: Once | INTRAMUSCULAR | 1 refills | Status: AC

## 2020-10-11 MED ORDER — AZELASTINE HCL 0.1 % NA SOLN: NASAL | 2 refills | Status: DC

## 2020-10-11 NOTE — Patient Instructions (Signed)
1. Chronic rhinitis - Testing today showed: grasses, ragweed, weeds, trees, indoor molds, outdoor molds, cat and dog - Copy of test results provided.  - Avoidance measures provided. - Continue with: your current nasal steroid drops - Start taking: Astelin (azelastine) 2 sprays per nostril 1-2 times daily as needed - You can use an extra dose of the antihistamine, if needed, for breakthrough symptoms.  - Consider nasal saline rinses 1-2 times daily to remove allergens from the nasal cavities as well as help with mucous clearance (this is especially helpful to do before the nasal sprays are given) - We will start allergy shots as a means of long-term control. - Allergy shots "re-train" and "reset" the immune system to ignore environmental allergens and decrease the resulting immune response to those allergens (sneezing, itchy watery eyes, runny nose, nasal congestion, etc).    - Allergy shots improve symptoms in 75-85% of patients.  - Make an appointment to start allergy shots in 2-3 weeks.   2. Return in about 3 months (around 01/11/2021) for an office visit.    Please inform us of any [facility] visits, hospitalizations, or changes in symptoms. Call us before going to the ED for breathing or allergy symptoms since we might be able to fit you in for a sick visit. Feel free to contact us anytime with any questions, problems, or concerns.  It was a pleasure to meet you today!  Websites that have reliable patient information: 1. American Academy of Asthma, Allergy, and Immunology: www.aaaai.org 2. Food Allergy Research and Education (FARE): foodallergy.org 3. Mothers of Asthmatics: http://www.asthmacommunitynetwork.org 4. American College of Allergy, Asthma, and Immunology: www.acaai.org   COVID-19 Vaccine Information can be found at: [email or web address] For questions related to vaccine distribution or appointments, please email  [email or web address] or call [phone].   We realize that you might be concerned about having an allergic reaction to the COVID19 vaccines. To help with that concern, WE ARE OFFERING THE COVID19 VACCINES IN OUR OFFICE! Ask the front desk for dates!     "Like" [place] on Facebook and Instagram for our latest updates!      A healthy democracy works best when [employer] participate! Make sure you are registered to vote! If you have moved or changed any of your contact information, you will need to get this updated before voting!  In some cases, you MAY be able to register to vote online: [email or web address]     Airborne Adult Perc - 10/11/20 1016    Time Antigen Placed 1016    Allergen Manufacturer Waynette Buttery    Location Back    Number of Test 59    Panel 1 Select    1. Control-Buffer 50% Glycerol Negative    2. Control-Histamine 1 mg/ml 2+    3. Albumin saline Negative    4. Bahia 3+    5. [place] Negative    6. Johnson 3+    7. [place] Blue 2+    8. Meadow Fescue Negative    9. Perennial Rye 4+    10. Sweet Vernal Negative    11. Timothy 4+    12. Cocklebur Negative    13. Burweed Marshelder Negative    14. Ragweed, short Negative    15. Ragweed, Giant Negative    16. Plantain,  English 2+    17. Lamb's Quarters Negative    18. Sheep Sorrell 3+    19. Rough Pigweed Negative    20. Marsh Elder, Rough Negative    21. Mugwort, Common  2+    22. Ash mix Negative    23. Birch mix 2+    24. Beech American Negative    25. Box, Elder Negative    26. Cedar, red Negative    27. Cottonwood, [place] Negative    28. Elm mix Negative    29. Hickory 4+    30. Maple mix Negative    31. Oak, [place] mix 2+    32. Pecan Pollen 4+    33. Pine mix Negative    34. Sycamore Eastern 2+    35. Walnut, Black Pollen 3+    36. Alternaria alternata Negative    37. Cladosporium Herbarum Negative    38. Aspergillus mix Negative    39. Penicillium mix Negative     40. Bipolaris sorokiniana (Helminthosporium) Negative    41. Drechslera spicifera (Curvularia) Negative    42. Mucor plumbeus 2+    43. Fusarium moniliforme 2+    44. Aureobasidium pullulans (pullulara) 2+    45. Rhizopus oryzae 2+    46. Botrytis cinera Negative    47. Epicoccum nigrum Negative    48. Phoma betae Negative    49. Candida Albicans Negative    50. Trichophyton mentagrophytes Negative    51. Mite, D Farinae  5,000 AU/ml Negative    52. Mite, D Pteronyssinus  5,000 AU/ml Negative    53. Cat Hair 10,000 BAU/ml Negative    54.  Dog Epithelia Negative    55. Mixed Feathers Negative    56. Horse Epithelia Negative    57. Cockroach, German Negative    58. Mouse Negative    59. Tobacco Leaf Negative          Intradermal - 10/11/20 1056    Time Antigen Placed 1056    Allergen Manufacturer Waynette Buttery    Location Arm    Number of Test 8    Intradermal Select    Control Negative    Ragweed mix 3+    Mold 1 Negative    Mold 2 Negative    Cat 1+    Dog 1+    Cockroach Negative    Mite mix Negative          Reducing Pollen Exposure  The American Academy of Allergy, Asthma and Immunology suggests the following steps to reduce your exposure to pollen during allergy seasons.    1. Do not hang sheets or clothing out to dry; pollen may collect on these items. 2. Do not mow lawns or spend time around freshly cut grass; mowing stirs up pollen. 3. Keep windows closed at night.  Keep car windows closed while driving. 4. Minimize morning activities outdoors, a time when pollen counts are usually at their highest. 5. Stay indoors as much as possible when pollen counts or humidity is high and on windy days when pollen tends to remain in the air longer. 6. Use air conditioning when possible.  Many air conditioners have filters that trap the pollen spores. 7. Use a HEPA room air filter to remove pollen form the indoor air you breathe.  Control of Mold Allergen   Mold and fungi can  grow on a variety of surfaces provided certain temperature and moisture conditions exist.  Outdoor molds grow on plants, decaying vegetation and soil.  The major outdoor mold, Alternaria and Cladosporium, are found in very high numbers during hot and dry conditions.  Generally, a late Summer - Fall peak is seen for common outdoor fungal spores.  Rain will temporarily lower  outdoor mold spore count, but counts rise rapidly when the rainy period ends.  The most important indoor molds are Aspergillus and Penicillium.  Dark, humid and poorly ventilated basements are ideal sites for mold growth.  The next most common sites of mold growth are the bathroom and the kitchen.  Outdoor (Seasonal) Mold Control  Positive outdoor molds via skin testing: Mucor  1. Use air conditioning and keep windows closed 2. Avoid exposure to decaying vegetation. 3. Avoid leaf raking. 4. Avoid grain handling. 5. Consider wearing a face mask if working in moldy areas.  6.   Indoor (Perennial) Mold Control   Positive indoor molds via skin testing: Fusarium, Aureobasidium (Pullulara) and Rhizopus  1. Maintain humidity below 50%. 2. Clean washable surfaces with 5% bleach solution. 3. Remove sources e.g. contaminated carpets.     Control of Dog or Cat Allergen  Avoidance is the best way to manage a dog or cat allergy. If you have a dog or cat and are allergic to dog or cats, consider removing the dog or cat from the home. If you have a dog or cat but don't want to find it a new home, or if your family wants a pet even though someone in the household is allergic, here are some strategies that may help keep symptoms at bay:  1. Keep the pet out of your bedroom and restrict it to only a few rooms. Be advised that keeping the dog or cat in only one room will not limit the allergens to that room. 2. Don't pet, hug or kiss the dog or cat; if you do, wash your hands with soap and water. 3. High-efficiency particulate air  (HEPA) cleaners run continuously in a bedroom or living room can reduce allergen levels over time. 4. Regular use of a high-efficiency vacuum cleaner or a central vacuum can reduce allergen levels. 5. Giving your dog or cat a bath at least once a week can reduce airborne allergen.  Allergy Shots   Allergies are the result of a chain reaction that starts in the immune system. Your immune system controls how your body defends itself. For instance, if you have an allergy to pollen, your immune system identifies pollen as an invader or allergen. Your immune system overreacts by producing antibodies called Immunoglobulin E (IgE). These antibodies travel to cells that release chemicals, causing an allergic reaction.  The concept behind allergy immunotherapy, whether it is received in the form of shots or tablets, is that the immune system can be desensitized to specific allergens that trigger allergy symptoms. Although it requires time and patience, the payback can be long-term relief.  How Do Allergy Shots Work?  Allergy shots work much like a vaccine. Your body responds to injected amounts of a particular allergen given in increasing doses, eventually developing a resistance and tolerance to it. Allergy shots can lead to decreased, minimal or no allergy symptoms.  There generally are two phases: build-up and maintenance. Build-up often ranges from three to six months and involves receiving injections with increasing amounts of the allergens. The shots are typically given once or twice a week, though more rapid build-up schedules are sometimes used.  The maintenance phase begins when the most effective dose is reached. This dose is different for each person, depending on how allergic you are and your response to the build-up injections. Once the maintenance dose is reached, there are longer periods between injections, typically two to four weeks.  Occasionally doctors give cortisone-type shots that  can  temporarily reduce allergy symptoms. These types of shots are different and should not be confused with allergy immunotherapy shots.  Who Can Be Treated with Allergy Shots?  Allergy shots may be a good treatment approach for people with allergic rhinitis (hay fever), allergic asthma, conjunctivitis (eye allergy) or stinging insect allergy.   Before deciding to begin allergy shots, you should consider:  . The length of allergy season and the severity of your symptoms . Whether medications and/or changes to your environment can control your symptoms . Your desire to avoid long-term medication use . Time: allergy immunotherapy requires a major time commitment . Cost: may vary depending on your insurance coverage  Allergy shots for children age 31 and older are effective and often well tolerated. They might prevent the onset of new allergen sensitivities or the progression to asthma.  Allergy shots are not started on patients who are pregnant but can be continued on patients who become pregnant while receiving them. In some patients with other medical conditions or who take certain common medications, allergy shots may be of risk. It is important to mention other medications you talk to your allergist.   When Will I Feel Better?  Some may experience decreased allergy symptoms during the build-up phase. For others, it may take as long as 12 months on the maintenance dose. If there is no improvement after a year of maintenance, your allergist will discuss other treatment options with you.  If you aren't responding to allergy shots, it may be because there is not enough dose of the allergen in your vaccine or there are missing allergens that were not identified during your allergy testing. Other reasons could be that there are high levels of the allergen in your environment or major exposure to non-allergic triggers like tobacco smoke.  What Is the Length of Treatment?  Once the maintenance dose  is reached, allergy shots are generally continued for three to five years. The decision to stop should be discussed with your allergist at that time. Some people may experience a permanent reduction of allergy symptoms. Others may relapse and a longer course of allergy shots can be considered.  What Are the Possible Reactions?  The two types of adverse reactions that can occur with allergy shots are local and systemic. Common local reactions include very mild redness and swelling at the injection site, which can happen immediately or several hours after. A systemic reaction, which is less common, affects the entire body or a particular body system. They are usually mild and typically respond quickly to medications. Signs include increased allergy symptoms such as sneezing, a stuffy nose or hives.  Rarely, a serious systemic reaction called anaphylaxis can develop. Symptoms include swelling in the throat, wheezing, a feeling of tightness in the chest, nausea or dizziness. Most serious systemic reactions develop within 30 minutes of allergy shots. This is why it is strongly recommended you wait in your doctor's office for 30 minutes after your injections. Your allergist is trained to watch for reactions, and his or her staff is trained and equipped with the proper medications to identify and treat them.  Who Should Administer Allergy Shots?  The preferred location for receiving shots is your prescribing allergist's office. Injections can sometimes be given at another facility where the physician and staff are trained to recognize and treat reactions, and have received instructions by your prescribing allergist.

## 2020-10-11 NOTE — Progress Notes (Signed)
NEW PATIENT  Date of Service/Encounter:  10/11/20  Referring provider: Georgann Housekeeper, MD   Assessment:   Seasonal and perennial allergic rhinitis (grasses, ragweed, weeds, trees, indoor molds, outdoor molds, cat and dog) - initiating allergen immunotherapy   Nasal septal deviation and septal spur  Plan/Recommendations:   1. Chronic rhinitis - Testing today showed: grasses, ragweed, weeds, trees, indoor molds, outdoor molds, cat and dog - Copy of test results provided.  - Avoidance measures provided. - Continue with: your current nasal steroid drops - Start taking: Astelin (azelastine) 2 sprays per nostril 1-2 times daily as needed - You can use an extra dose of the antihistamine, if needed, for breakthrough symptoms.  - Consider nasal saline rinses 1-2 times daily to remove allergens from the nasal cavities as well as help with mucous clearance (this is especially helpful to do before the nasal sprays are given) - We will start allergy shots as a means of long-term control. - Allergy shots "re-train" and "reset" the immune system to ignore environmental allergens and decrease the resulting immune response to those allergens (sneezing, itchy watery eyes, runny nose, nasal congestion, etc).    - Allergy shots improve symptoms in 75-85% of patients.  - Make an appointment to start allergy shots in 2-3 weeks.   2. Return in about 3 months (around 01/11/2021) for an office visit.   Subjective:   Steven Benson is a 55 y.o. male presenting today for evaluation of  Chief Complaint  Patient presents with  . Sinus Problem    Steven Benson has a history of the following: Patient Active Problem List   Diagnosis Date Noted  . Seasonal and perennial allergic rhinitis 10/11/2020    History obtained from: chart review and patient.  Steven Benson was referred by Georgann Housekeeper, MD.     Steven Benson is a 55 y.o. male presenting for an evaluation of chronic rhinitis and recurrent  sinusitis.    Allergic Rhinitis Symptom History: He reports that he has year round congstion. It does seem to get worse in the spring time. He has steroid drops that he has been on since the 1990s. It works fairly well and gets temporary relief. He has tried Flonase in [employer] but this is did not work. He has never been on a nasal antihistamine. He has used nasal deoncgestants in the past. He uses some "little pills" to use as needed. .  He had blood testing performed in June 2021 that was positive to [place] grass, Timothy grass, Johnson grass, birch, almond, Cottonwood, pecan, mulberry, and ragweed. He was never on shots in the past. He saw the allergist in the [place] once.   He gets sinus infections 1-2 times per year at the most. He gets antibiotics for these episodes. He does not et antibiotics for anything else. He grew up in [place].   He had a CT of his sinuses in February 2019 that showed a nasal septal deviation and a septal spur.  He also had patchy polyploid mucoperiosteal thickening in both maxillary sinuses. He is followed by an ENT at the [facility], who is pushing for surgery. His ENT is Dr. Loreta Ave. Carley is not set on the surgery and not convinced that it is needed.   He works for [employer]. He was in the [employer] for 6 years in the [employer].   Otherwise, there is no history of other atopic diseases, including asthma, food allergies, drug allergies, stinging insect allergies, eczema, urticaria  or contact dermatitis. There is no significant infectious history. Vaccinations are up to date.    Past Medical History: Patient Active Problem List   Diagnosis Date Noted  . Seasonal and perennial allergic rhinitis 10/11/2020    Medication List:  Allergies as of 10/11/2020      Reactions   Accupril [quinapril Hcl] Anaphylaxis, Swelling   Swelling of lips   Latex Rash   Tape Itching, Rash      Medication List       Accurate as of October 11, 2020 12:58 PM. If you have any  questions, ask your nurse or doctor.        STOP taking these medications   meclizine 25 MG tablet Commonly known as: ANTIVERT Stopped by: Alfonse SpruceJoel Louis Gallagher, MD     TAKE these medications   amLODipine 5 MG tablet Commonly known as: NORVASC Take 5 mg by mouth daily.   azelastine 0.1 % nasal spray Commonly known as: ASTELIN 2 sprays per nostril 1-2 times daily as needed. Started by: Alfonse SpruceJoel Louis Gallagher, MD   EPINEPHrine 0.3 mg/0.3 mL Soaj injection Commonly known as: EpiPen 2-Pak Inject 0.3 mg into the muscle once for 1 dose. Started by: Alfonse SpruceJoel Louis Gallagher, MD   EZETIMIBE-ROSUVASTATIN PO Take 0.5 tablets by mouth daily. Strength unknown   fluticasone 50 MCG/ACT nasal spray Commonly known as: FLONASE Place 1-2 sprays into both nostrils daily as needed for allergies or rhinitis.   hydrochlorothiazide 12.5 MG tablet Commonly known as: HYDRODIURIL Take 12.5 mg by mouth every morning.   ibuprofen 200 MG tablet Commonly known as: ADVIL Take 200 mg by mouth every 4 (four) hours as needed for headache or moderate pain.   montelukast 10 MG tablet Commonly known as: SINGULAIR Take 10 mg by mouth at bedtime.   mupirocin ointment 2 % Commonly known as: BACTROBAN 1 application 2 (two) times daily. Septum   prednisoLONE Acetate P-F 1 % ophthalmic suspension Generic drug: prednisoLONE acetate 3 drops daily.   sodium chloride 0.65 % nasal spray Commonly known as: OCEAN Place 2 sprays into the nose 3 (three) times daily.       Birth History: non-contributory  Developmental History: non-contributory  Past Surgical History: Past Surgical History:  Procedure Laterality Date  . ADENOIDECTOMY    . COLONOSCOPY WITH PROPOFOL N/A 01/03/2015   Procedure: COLONOSCOPY WITH PROPOFOL;  Surgeon: Charolett BumpersMartin K Johnson, MD;  Location: [facility];  Service: Endoscopy;  Laterality: N/A;  . HERNIA REPAIR     inguinal hernia 10 yrs ago  . TONSILLECTOMY       Family  History: History reviewed. No pertinent family history.   Social History: Steven Benson lives at home with his wife and 15yo son.  He lives in a house that is 57 years old.  There is hardwood throughout the home.  They have carpeting in the bedrooms.  There is gas heating and central cooling.  There are no animals inside or outside of the home.  There are no dust mite covers on the bedding.  There is no tobacco exposure.  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  He is exposed to fumes, chemicals, and dust.  He does not use a HEPA filter in the home.  He does not live near an interstate or industrial area.   Review of Systems  Constitutional: Negative.  Negative for fever, malaise/fatigue and weight loss.  HENT: Positive for congestion. Negative for ear discharge and ear pain.  Positive for postnasal drip.  Eyes: Negative for pain, discharge and redness.  Respiratory: Negative for cough, sputum production, shortness of breath and wheezing.   Cardiovascular: Negative.  Negative for chest pain and palpitations.  Gastrointestinal: Negative for abdominal pain, constipation, diarrhea, heartburn, nausea and vomiting.  Skin: Negative.  Negative for itching and rash.  Neurological: Negative for dizziness and headaches.  Endo/Heme/Allergies: Negative for environmental allergies. Does not bruise/bleed easily.       Objective:   Blood pressure 124/66, pulse 70, temperature 97.9 F (36.6 C), temperature source Temporal, resp. rate 18, height 6\' 1"  (1.854 m), weight 212 lb 6.4 oz (96.3 kg), SpO2 99 %. Body mass index is 28.02 kg/m.   Physical Exam:   Physical Exam Constitutional:      Appearance: He is well-developed.     Comments: Very pleasant and talkative.  HENT:     Head: Normocephalic and atraumatic.     Right Ear: Tympanic membrane, ear canal and external ear normal. No drainage, swelling or tenderness. Tympanic membrane is not injected, scarred, erythematous,  retracted or bulging.     Left Ear: Tympanic membrane, ear canal and external ear normal. No drainage, swelling or tenderness. Tympanic membrane is not injected, scarred, erythematous, retracted or bulging.     Nose: Septal deviation present. No nasal deformity, mucosal edema, rhinorrhea or epistaxis.     Right Turbinates: Enlarged and swollen.     Left Turbinates: Enlarged and swollen.     Right Sinus: No maxillary sinus tenderness or frontal sinus tenderness.     Left Sinus: No maxillary sinus tenderness or frontal sinus tenderness.     Mouth/Throat:     Mouth: Oropharynx is clear and moist. Mucous membranes are not pale and not dry.     Pharynx: Uvula midline.  Eyes:     General:        Right eye: No discharge.        Left eye: No discharge.     Extraocular Movements: EOM normal.     Conjunctiva/sclera: Conjunctivae normal.     Right eye: Right conjunctiva is not injected. No chemosis.    Left eye: Left conjunctiva is not injected. No chemosis.    Pupils: Pupils are equal, round, and reactive to light.  Cardiovascular:     Rate and Rhythm: Normal rate and regular rhythm.     Heart sounds: Normal heart sounds.  Pulmonary:     Effort: Pulmonary effort is normal. No tachypnea, accessory muscle usage or respiratory distress.     Breath sounds: Normal breath sounds. No wheezing, rhonchi or rales.  Chest:     Chest wall: No tenderness.  Abdominal:     Tenderness: There is no abdominal tenderness. There is no guarding or rebound.  Lymphadenopathy:     Head:     Right side of head: No submandibular, tonsillar or occipital adenopathy.     Left side of head: No submandibular, tonsillar or occipital adenopathy.     Cervical: No cervical adenopathy.  Skin:    Coloration: Skin is not pale.     Findings: No abrasion, erythema, petechiae or rash. Rash is not papular, urticarial or vesicular.  Neurological:     Mental Status: He is alert.  Psychiatric:        Mood and Affect: Mood and  affect normal.        Behavior: Behavior is cooperative.      Diagnostic studies:   Allergy Studies:     Airborne Adult Perc -  10/11/20 1016    Time Antigen Placed 1016    Allergen Manufacturer Waynette Buttery    Location Back    Number of Test 59    Panel 1 Select    1. Control-Buffer 50% Glycerol Negative    2. Control-Histamine 1 mg/ml 2+    3. Albumin saline Negative    4. Bahia 3+    5. [place] Negative    6. Johnson 3+    7. Kentucky Blue 2+    8. Meadow Fescue Negative    9. Perennial Rye 4+    10. Sweet Vernal Negative    11. Timothy 4+    12. Cocklebur Negative    13. Burweed Marshelder Negative    14. Ragweed, short Negative    15. Ragweed, Giant Negative    16. Plantain,  English 2+    17. Lamb's Quarters Negative    18. Sheep Sorrell 3+    19. Rough Pigweed Negative    20. Marsh Elder, Rough Negative    21. Mugwort, Common 2+    22. Ash mix Negative    23. Birch mix 2+    24. Beech American Negative    25. Box, Elder Negative    26. Cedar, red Negative    27. Cottonwood, Guinea-Bissau Negative    28. Elm mix Negative    29. Hickory 4+    30. Maple mix Negative    31. Oak, Guinea-Bissau mix 2+    32. Pecan Pollen 4+    33. Pine mix Negative    34. Sycamore Eastern 2+    35. Walnut, Black Pollen 3+    36. Alternaria alternata Negative    37. Cladosporium Herbarum Negative    38. Aspergillus mix Negative    39. Penicillium mix Negative    40. Bipolaris sorokiniana (Helminthosporium) Negative    41. Drechslera spicifera (Curvularia) Negative    42. Mucor plumbeus 2+    43. Fusarium moniliforme 2+    44. Aureobasidium pullulans (pullulara) 2+    45. Rhizopus oryzae 2+    46. Botrytis cinera Negative    47. Epicoccum nigrum Negative    48. Phoma betae Negative    49. Candida Albicans Negative    50. Trichophyton mentagrophytes Negative    51. Mite, D Farinae  5,000 AU/ml Negative    52. Mite, D Pteronyssinus  5,000 AU/ml Negative    53. Cat Hair 10,000 BAU/ml  Negative    54.  Dog Epithelia Negative    55. Mixed Feathers Negative    56. Horse Epithelia Negative    57. Cockroach, German Negative    58. Mouse Negative    59. Tobacco Leaf Negative          Intradermal - 10/11/20 1056    Time Antigen Placed 1056    Allergen Manufacturer Waynette Buttery    Location Arm    Number of Test 8    Intradermal Select    Control Negative   Simultaneous filing. User may not have seen previous data.   Ragweed mix 3+   Simultaneous filing. User may not have seen previous data.   Mold 1 Negative   Simultaneous filing. User may not have seen previous data.   Mold 2 Negative   Simultaneous filing. User may not have seen previous data.   Cat 1+   Simultaneous filing. User may not have seen previous data.   Dog 1+   Simultaneous filing. User may not have seen previous data.   Cockroach  Negative   Simultaneous filing. User may not have seen previous data.   Mite mix Negative   Simultaneous filing. User may not have seen previous data.          Allergy testing results were read and interpreted by myself, documented by clinical staff.         Malachi Bonds, MD [facility]

## 2020-10-12 DIAGNOSIS — J3081 Allergic rhinitis due to animal (cat) (dog) hair and dander: Secondary | ICD-10-CM | POA: Diagnosis not present

## 2020-10-12 NOTE — Progress Notes (Signed)
VIALS EXP 10-12-21 °

## 2020-10-12 NOTE — Progress Notes (Deleted)
Aeroallergen Immunotherapy    Patient Details  Name: PRESS CASALE  MRN: [id number]  Date of Birth: 06-03-1966   Order 1 of 2   Vial Label: G/T/C/D   0.3 ml (Volume) BAU Concentration -- 7 Grass Mix* 100,000 ([address] [place], [place], [place], [place] Rye, RedTop, Sweet Vernal, Timothy)  0.3 ml (Volume) BAU Concentration -- [place] 10,000  0.2 ml (Volume) 1:20 Concentration -- Johnson  0.5 ml (Volume) 1:20 Concentration -- Eastern 10 Tree Mix (also Sweet Gum)  0.1 ml (Volume) 1:10 Concentration -- Hickory*  0.2 ml (Volume) 1:10 Concentration -- Oak, Guinea-Bissau mix*  0.1 ml (Volume) 1:10 Concentration -- Sycamore Eastern*  0.2 ml (Volume) 1:20 Concentration -- Walnut, Black Pollen  0.5 ml (Volume) 1:10 Concentration -- Cat Hair  0.5 ml (Volume) 1:10 Concentration -- Dog Epithelia    3.4 ml Extract Subtotal  1.6 ml Diluent  5.0 ml Maintenance Total    Final Concentration above is stated in weight/volume (wt/vol). Allergen units (AU/ml) biological units (BAU/ml). The total volume is 5 ml.    Schedule: B   Special Instructions: none

## 2020-10-12 NOTE — Progress Notes (Signed)
Aeroallergen Immunotherapy    Patient Details  Name: Steven Benson  MRN: [id number]  Date of Birth: [birth year]   Order 1 of 2   Vial Label: G/T/C/D   0.3 ml (Volume) BAU Concentration -- 7 Grass Mix* 100,000 ([address] [place], [place], [place], [place] Rye, RedTop, Sweet Vernal, Timothy)  0.3 ml (Volume) BAU Concentration -- [place] 10,000  0.2 ml (Volume) 1:20 Concentration -- Johnson  0.5 ml (Volume) 1:20 Concentration -- Eastern 10 Tree Mix (also Sweet Gum)  0.1 ml (Volume) 1:10 Concentration -- Hickory*  0.2 ml (Volume) 1:10 Concentration -- Oak, Guinea-Bissau mix*  0.1 ml (Volume) 1:10 Concentration -- Sycamore Eastern*  0.2 ml (Volume) 1:20 Concentration -- Walnut, Black Pollen  0.5 ml (Volume) 1:10 Concentration -- Cat Hair  0.5 ml (Volume) 1:10 Concentration -- Dog Epithelia    2.9 ml Extract Subtotal  2.1 ml Diluent  5.0 ml Maintenance Total    Final Concentration above is stated in weight/volume (wt/vol). Allergen units (AU/ml) biological units (BAU/ml). The total volume is 5 ml.    Schedule: B   Special Instructions: none

## 2020-10-12 NOTE — Progress Notes (Signed)
Aeroallergen Immunotherapy    Patient Details  Name: Steven Benson  MRN: [id number]  Date of Birth: [birth year]   Order 2 of 2   Vial Label: Molds/RW   0.3 ml (Volume) 1:20 Concentration -- Ragweed Mix  0.2 ml (Volume) 1:10 Concentration -- Mucor plumbeus  0.2 ml (Volume) 1:10 Concentration -- Fusarium moniliforme  0.2 ml (Volume) 1:40 Concentration -- Aureobasidium pullulans  0.2 ml (Volume) 1:10 Concentration -- Rhizopus oryzae    1.1 ml Extract Subtotal  3.9 ml Diluent  5.0 ml Maintenance Total    Final Concentration above is stated in weight/volume (wt/vol). Allergen units (AU/ml) biological units (BAU/ml). The total volume is 5 ml.    Schedule: B   Special Instructions: none

## 2020-10-13 ENCOUNTER — Telehealth: Payer: Self-pay

## 2020-10-13 DIAGNOSIS — J302 Other seasonal allergic rhinitis: Secondary | ICD-10-CM | POA: Diagnosis not present

## 2020-10-13 NOTE — Telephone Encounter (Signed)
[facility] called stating their [facility] only has Amneal Epi Pen. They would like to know if it is okay to dispense this drug instead of the Mylan Generic?  Please Advise.

## 2020-10-14 ENCOUNTER — Encounter: Payer: Self-pay | Admitting: Allergy & Immunology

## 2020-10-14 NOTE — Telephone Encounter (Signed)
Spoke with the [place] and informed them to fill whichever device they are able to fill for the patient.

## 2020-10-28 ENCOUNTER — Ambulatory Visit: Payer: Self-pay

## 2020-11-03 ENCOUNTER — Ambulatory Visit: Payer: Self-pay

## 2020-11-04 ENCOUNTER — Encounter: Payer: Self-pay | Admitting: Allergy

## 2020-11-04 ENCOUNTER — Ambulatory Visit (INDEPENDENT_AMBULATORY_CARE_PROVIDER_SITE_OTHER): Payer: No Typology Code available for payment source | Admitting: *Deleted

## 2020-11-04 DIAGNOSIS — J309 Allergic rhinitis, unspecified: Secondary | ICD-10-CM | POA: Diagnosis not present

## 2020-11-04 NOTE — Progress Notes (Signed)
Immunotherapy   Patient Details  Name: TYVION EDMONDSON MRN: [id number] Date of Birth: 1966-05-24  11/04/2020  Darnell Level started injections for  blue vials 1/1:100,000 molds-ragweed and grass-tree-cat-dog Following schedule: B  Frequency:1 time per week Epi-Pen:Epi-Pen Available  Consent signed and patient instructions given. Waited 30 minutes with no problems.   Maurine Simmering 11/04/2020, 11:11 AM

## 2020-11-11 ENCOUNTER — Ambulatory Visit (INDEPENDENT_AMBULATORY_CARE_PROVIDER_SITE_OTHER): Payer: No Typology Code available for payment source

## 2020-11-11 ENCOUNTER — Encounter: Payer: Self-pay | Admitting: Allergy

## 2020-11-11 DIAGNOSIS — J309 Allergic rhinitis, unspecified: Secondary | ICD-10-CM

## 2020-11-17 ENCOUNTER — Ambulatory Visit (INDEPENDENT_AMBULATORY_CARE_PROVIDER_SITE_OTHER): Payer: No Typology Code available for payment source | Admitting: *Deleted

## 2020-11-17 ENCOUNTER — Encounter: Payer: Self-pay | Admitting: Family Medicine

## 2020-11-17 DIAGNOSIS — J309 Allergic rhinitis, unspecified: Secondary | ICD-10-CM

## 2020-11-24 ENCOUNTER — Encounter: Payer: Self-pay | Admitting: Allergy

## 2020-11-24 ENCOUNTER — Ambulatory Visit (INDEPENDENT_AMBULATORY_CARE_PROVIDER_SITE_OTHER): Payer: No Typology Code available for payment source | Admitting: *Deleted

## 2020-11-24 DIAGNOSIS — J309 Allergic rhinitis, unspecified: Secondary | ICD-10-CM

## 2020-12-01 ENCOUNTER — Ambulatory Visit (INDEPENDENT_AMBULATORY_CARE_PROVIDER_SITE_OTHER): Payer: No Typology Code available for payment source | Admitting: *Deleted

## 2020-12-01 ENCOUNTER — Encounter: Payer: Self-pay | Admitting: Family Medicine

## 2020-12-01 DIAGNOSIS — J309 Allergic rhinitis, unspecified: Secondary | ICD-10-CM

## 2020-12-08 ENCOUNTER — Encounter: Payer: Self-pay | Admitting: Allergy

## 2020-12-08 ENCOUNTER — Ambulatory Visit (INDEPENDENT_AMBULATORY_CARE_PROVIDER_SITE_OTHER): Payer: No Typology Code available for payment source | Admitting: *Deleted

## 2020-12-08 DIAGNOSIS — J309 Allergic rhinitis, unspecified: Secondary | ICD-10-CM

## 2020-12-15 ENCOUNTER — Ambulatory Visit (INDEPENDENT_AMBULATORY_CARE_PROVIDER_SITE_OTHER): Payer: No Typology Code available for payment source | Admitting: *Deleted

## 2020-12-15 DIAGNOSIS — J309 Allergic rhinitis, unspecified: Secondary | ICD-10-CM | POA: Diagnosis not present

## 2020-12-22 ENCOUNTER — Encounter: Payer: Self-pay | Admitting: Allergy

## 2020-12-22 ENCOUNTER — Ambulatory Visit (INDEPENDENT_AMBULATORY_CARE_PROVIDER_SITE_OTHER): Payer: No Typology Code available for payment source | Admitting: *Deleted

## 2020-12-22 DIAGNOSIS — J309 Allergic rhinitis, unspecified: Secondary | ICD-10-CM

## 2020-12-29 ENCOUNTER — Ambulatory Visit (INDEPENDENT_AMBULATORY_CARE_PROVIDER_SITE_OTHER): Payer: No Typology Code available for payment source

## 2020-12-29 ENCOUNTER — Encounter: Payer: Self-pay | Admitting: Allergy

## 2020-12-29 DIAGNOSIS — J309 Allergic rhinitis, unspecified: Secondary | ICD-10-CM

## 2021-01-05 ENCOUNTER — Ambulatory Visit (INDEPENDENT_AMBULATORY_CARE_PROVIDER_SITE_OTHER): Payer: No Typology Code available for payment source | Admitting: *Deleted

## 2021-01-05 ENCOUNTER — Encounter: Payer: Self-pay | Admitting: Allergy

## 2021-01-05 DIAGNOSIS — J309 Allergic rhinitis, unspecified: Secondary | ICD-10-CM

## 2021-01-13 ENCOUNTER — Encounter: Payer: Self-pay | Admitting: Allergy

## 2021-01-13 ENCOUNTER — Ambulatory Visit (INDEPENDENT_AMBULATORY_CARE_PROVIDER_SITE_OTHER): Payer: No Typology Code available for payment source

## 2021-01-13 DIAGNOSIS — J309 Allergic rhinitis, unspecified: Secondary | ICD-10-CM | POA: Diagnosis not present

## 2021-01-19 ENCOUNTER — Encounter: Payer: Self-pay | Admitting: Allergy

## 2021-01-19 ENCOUNTER — Ambulatory Visit (INDEPENDENT_AMBULATORY_CARE_PROVIDER_SITE_OTHER): Payer: No Typology Code available for payment source | Admitting: *Deleted

## 2021-01-19 DIAGNOSIS — J309 Allergic rhinitis, unspecified: Secondary | ICD-10-CM | POA: Diagnosis not present

## 2021-02-02 ENCOUNTER — Ambulatory Visit (INDEPENDENT_AMBULATORY_CARE_PROVIDER_SITE_OTHER): Payer: No Typology Code available for payment source | Admitting: *Deleted

## 2021-02-02 ENCOUNTER — Encounter: Payer: Self-pay | Admitting: Allergy

## 2021-02-02 DIAGNOSIS — J309 Allergic rhinitis, unspecified: Secondary | ICD-10-CM | POA: Diagnosis not present

## 2021-02-09 ENCOUNTER — Encounter: Payer: Self-pay | Admitting: Allergy

## 2021-02-09 ENCOUNTER — Ambulatory Visit (INDEPENDENT_AMBULATORY_CARE_PROVIDER_SITE_OTHER): Payer: No Typology Code available for payment source

## 2021-02-09 DIAGNOSIS — J309 Allergic rhinitis, unspecified: Secondary | ICD-10-CM

## 2021-02-16 ENCOUNTER — Encounter: Payer: Self-pay | Admitting: Family Medicine

## 2021-02-16 ENCOUNTER — Ambulatory Visit (INDEPENDENT_AMBULATORY_CARE_PROVIDER_SITE_OTHER): Payer: No Typology Code available for payment source | Admitting: *Deleted

## 2021-02-16 DIAGNOSIS — J309 Allergic rhinitis, unspecified: Secondary | ICD-10-CM

## 2021-02-23 ENCOUNTER — Encounter: Payer: Self-pay | Admitting: Family

## 2021-02-23 ENCOUNTER — Ambulatory Visit (INDEPENDENT_AMBULATORY_CARE_PROVIDER_SITE_OTHER): Payer: No Typology Code available for payment source | Admitting: *Deleted

## 2021-02-23 DIAGNOSIS — J309 Allergic rhinitis, unspecified: Secondary | ICD-10-CM | POA: Diagnosis not present

## 2021-03-02 ENCOUNTER — Ambulatory Visit (INDEPENDENT_AMBULATORY_CARE_PROVIDER_SITE_OTHER): Payer: No Typology Code available for payment source | Admitting: *Deleted

## 2021-03-02 ENCOUNTER — Encounter: Payer: Self-pay | Admitting: Family

## 2021-03-02 DIAGNOSIS — J309 Allergic rhinitis, unspecified: Secondary | ICD-10-CM | POA: Diagnosis not present

## 2021-03-09 ENCOUNTER — Ambulatory Visit (INDEPENDENT_AMBULATORY_CARE_PROVIDER_SITE_OTHER): Payer: No Typology Code available for payment source | Admitting: *Deleted

## 2021-03-09 DIAGNOSIS — J309 Allergic rhinitis, unspecified: Secondary | ICD-10-CM | POA: Diagnosis not present

## 2021-03-16 ENCOUNTER — Encounter: Payer: Self-pay | Admitting: Allergy

## 2021-03-16 ENCOUNTER — Ambulatory Visit (INDEPENDENT_AMBULATORY_CARE_PROVIDER_SITE_OTHER): Payer: No Typology Code available for payment source

## 2021-03-16 DIAGNOSIS — J309 Allergic rhinitis, unspecified: Secondary | ICD-10-CM

## 2021-03-23 ENCOUNTER — Ambulatory Visit (INDEPENDENT_AMBULATORY_CARE_PROVIDER_SITE_OTHER): Payer: No Typology Code available for payment source | Admitting: *Deleted

## 2021-03-23 ENCOUNTER — Encounter: Payer: Self-pay | Admitting: Allergy

## 2021-03-23 DIAGNOSIS — J309 Allergic rhinitis, unspecified: Secondary | ICD-10-CM

## 2021-03-30 ENCOUNTER — Ambulatory Visit (INDEPENDENT_AMBULATORY_CARE_PROVIDER_SITE_OTHER): Payer: No Typology Code available for payment source | Admitting: *Deleted

## 2021-03-30 ENCOUNTER — Encounter: Payer: Self-pay | Admitting: Allergy

## 2021-03-30 DIAGNOSIS — J309 Allergic rhinitis, unspecified: Secondary | ICD-10-CM

## 2021-04-07 ENCOUNTER — Encounter: Payer: Self-pay | Admitting: Allergy & Immunology

## 2021-04-07 ENCOUNTER — Ambulatory Visit (INDEPENDENT_AMBULATORY_CARE_PROVIDER_SITE_OTHER): Payer: No Typology Code available for payment source

## 2021-04-07 DIAGNOSIS — J309 Allergic rhinitis, unspecified: Secondary | ICD-10-CM | POA: Diagnosis not present

## 2021-04-13 ENCOUNTER — Encounter: Payer: Self-pay | Admitting: Allergy & Immunology

## 2021-04-13 ENCOUNTER — Ambulatory Visit (INDEPENDENT_AMBULATORY_CARE_PROVIDER_SITE_OTHER): Payer: No Typology Code available for payment source | Admitting: *Deleted

## 2021-04-13 DIAGNOSIS — J309 Allergic rhinitis, unspecified: Secondary | ICD-10-CM | POA: Diagnosis not present

## 2021-04-27 ENCOUNTER — Encounter: Payer: Self-pay | Admitting: Allergy & Immunology

## 2021-04-27 ENCOUNTER — Ambulatory Visit (INDEPENDENT_AMBULATORY_CARE_PROVIDER_SITE_OTHER): Payer: No Typology Code available for payment source | Admitting: *Deleted

## 2021-04-27 DIAGNOSIS — J309 Allergic rhinitis, unspecified: Secondary | ICD-10-CM

## 2021-05-04 ENCOUNTER — Ambulatory Visit (INDEPENDENT_AMBULATORY_CARE_PROVIDER_SITE_OTHER): Payer: No Typology Code available for payment source | Admitting: *Deleted

## 2021-05-04 ENCOUNTER — Encounter: Payer: Self-pay | Admitting: Allergy & Immunology

## 2021-05-04 DIAGNOSIS — J309 Allergic rhinitis, unspecified: Secondary | ICD-10-CM

## 2021-05-11 ENCOUNTER — Ambulatory Visit (INDEPENDENT_AMBULATORY_CARE_PROVIDER_SITE_OTHER): Payer: No Typology Code available for payment source

## 2021-05-11 ENCOUNTER — Encounter: Payer: Self-pay | Admitting: Allergy

## 2021-05-11 DIAGNOSIS — J309 Allergic rhinitis, unspecified: Secondary | ICD-10-CM | POA: Diagnosis not present

## 2021-05-18 ENCOUNTER — Encounter: Payer: Self-pay | Admitting: Allergy & Immunology

## 2021-05-18 ENCOUNTER — Ambulatory Visit (INDEPENDENT_AMBULATORY_CARE_PROVIDER_SITE_OTHER): Payer: No Typology Code available for payment source | Admitting: *Deleted

## 2021-05-18 DIAGNOSIS — J309 Allergic rhinitis, unspecified: Secondary | ICD-10-CM | POA: Diagnosis not present

## 2021-05-25 ENCOUNTER — Encounter: Payer: Self-pay | Admitting: Allergy

## 2021-05-25 ENCOUNTER — Ambulatory Visit (INDEPENDENT_AMBULATORY_CARE_PROVIDER_SITE_OTHER): Payer: No Typology Code available for payment source | Admitting: *Deleted

## 2021-05-25 DIAGNOSIS — J309 Allergic rhinitis, unspecified: Secondary | ICD-10-CM | POA: Diagnosis not present

## 2021-05-29 DIAGNOSIS — J3081 Allergic rhinitis due to animal (cat) (dog) hair and dander: Secondary | ICD-10-CM

## 2021-05-29 NOTE — Progress Notes (Signed)
VIALS MADE. EXP 05-29-22 

## 2021-05-30 DIAGNOSIS — J302 Other seasonal allergic rhinitis: Secondary | ICD-10-CM | POA: Diagnosis not present

## 2021-06-01 ENCOUNTER — Encounter: Payer: Self-pay | Admitting: Allergy & Immunology

## 2021-06-01 ENCOUNTER — Ambulatory Visit (INDEPENDENT_AMBULATORY_CARE_PROVIDER_SITE_OTHER): Payer: No Typology Code available for payment source | Admitting: *Deleted

## 2021-06-01 DIAGNOSIS — J309 Allergic rhinitis, unspecified: Secondary | ICD-10-CM

## 2021-06-08 ENCOUNTER — Encounter: Payer: Self-pay | Admitting: Allergy

## 2021-06-08 ENCOUNTER — Ambulatory Visit (INDEPENDENT_AMBULATORY_CARE_PROVIDER_SITE_OTHER): Payer: No Typology Code available for payment source | Admitting: *Deleted

## 2021-06-08 DIAGNOSIS — J309 Allergic rhinitis, unspecified: Secondary | ICD-10-CM | POA: Diagnosis not present

## 2021-06-15 ENCOUNTER — Ambulatory Visit (INDEPENDENT_AMBULATORY_CARE_PROVIDER_SITE_OTHER): Payer: No Typology Code available for payment source | Admitting: *Deleted

## 2021-06-15 DIAGNOSIS — J309 Allergic rhinitis, unspecified: Secondary | ICD-10-CM | POA: Diagnosis not present

## 2021-06-29 ENCOUNTER — Encounter: Payer: Self-pay | Admitting: Allergy & Immunology

## 2021-06-29 ENCOUNTER — Ambulatory Visit (INDEPENDENT_AMBULATORY_CARE_PROVIDER_SITE_OTHER): Payer: No Typology Code available for payment source | Admitting: *Deleted

## 2021-06-29 DIAGNOSIS — J309 Allergic rhinitis, unspecified: Secondary | ICD-10-CM

## 2021-07-05 ENCOUNTER — Encounter: Payer: Self-pay | Admitting: Allergy

## 2021-07-05 ENCOUNTER — Ambulatory Visit (INDEPENDENT_AMBULATORY_CARE_PROVIDER_SITE_OTHER): Payer: No Typology Code available for payment source

## 2021-07-05 DIAGNOSIS — J309 Allergic rhinitis, unspecified: Secondary | ICD-10-CM

## 2021-07-12 ENCOUNTER — Encounter: Payer: Self-pay | Admitting: Allergy

## 2021-07-12 ENCOUNTER — Ambulatory Visit (INDEPENDENT_AMBULATORY_CARE_PROVIDER_SITE_OTHER): Payer: No Typology Code available for payment source

## 2021-07-12 DIAGNOSIS — J309 Allergic rhinitis, unspecified: Secondary | ICD-10-CM

## 2021-07-20 ENCOUNTER — Encounter: Payer: Self-pay | Admitting: Allergy

## 2021-07-20 ENCOUNTER — Ambulatory Visit (INDEPENDENT_AMBULATORY_CARE_PROVIDER_SITE_OTHER): Payer: No Typology Code available for payment source

## 2021-07-20 DIAGNOSIS — J309 Allergic rhinitis, unspecified: Secondary | ICD-10-CM

## 2021-07-27 ENCOUNTER — Encounter: Payer: Self-pay | Admitting: Allergy & Immunology

## 2021-07-27 ENCOUNTER — Ambulatory Visit (INDEPENDENT_AMBULATORY_CARE_PROVIDER_SITE_OTHER): Payer: No Typology Code available for payment source

## 2021-07-27 DIAGNOSIS — J309 Allergic rhinitis, unspecified: Secondary | ICD-10-CM | POA: Diagnosis not present

## 2021-08-15 ENCOUNTER — Encounter: Payer: Self-pay | Admitting: Allergy and Immunology

## 2021-08-15 ENCOUNTER — Ambulatory Visit (INDEPENDENT_AMBULATORY_CARE_PROVIDER_SITE_OTHER): Payer: No Typology Code available for payment source | Admitting: *Deleted

## 2021-08-15 DIAGNOSIS — J309 Allergic rhinitis, unspecified: Secondary | ICD-10-CM | POA: Diagnosis not present

## 2021-08-21 ENCOUNTER — Telehealth: Payer: Self-pay | Admitting: Allergy & Immunology

## 2021-08-21 NOTE — Telephone Encounter (Signed)
Faxed renewal of authorization to [facility] [place] [facility] [phone]) I have emailed it to Nurse Clarisse Gouge ([email or web address]) and Nurse Gold ([email or web address]) as well as [email or web address].

## 2021-08-23 NOTE — Telephone Encounter (Signed)
Refaxed renewal of authorization request to [facility], [phone].

## 2021-08-24 ENCOUNTER — Ambulatory Visit (INDEPENDENT_AMBULATORY_CARE_PROVIDER_SITE_OTHER): Payer: No Typology Code available for payment source

## 2021-08-24 ENCOUNTER — Encounter: Payer: Self-pay | Admitting: Allergy & Immunology

## 2021-08-24 DIAGNOSIS — J309 Allergic rhinitis, unspecified: Secondary | ICD-10-CM

## 2021-08-31 ENCOUNTER — Ambulatory Visit (INDEPENDENT_AMBULATORY_CARE_PROVIDER_SITE_OTHER): Payer: No Typology Code available for payment source

## 2021-08-31 ENCOUNTER — Encounter: Payer: Self-pay | Admitting: Allergy & Immunology

## 2021-08-31 DIAGNOSIS — J309 Allergic rhinitis, unspecified: Secondary | ICD-10-CM | POA: Diagnosis not present

## 2021-09-07 ENCOUNTER — Ambulatory Visit (INDEPENDENT_AMBULATORY_CARE_PROVIDER_SITE_OTHER): Payer: No Typology Code available for payment source

## 2021-09-07 ENCOUNTER — Encounter: Payer: Self-pay | Admitting: Allergy & Immunology

## 2021-09-07 DIAGNOSIS — J309 Allergic rhinitis, unspecified: Secondary | ICD-10-CM | POA: Diagnosis not present

## 2021-09-14 ENCOUNTER — Ambulatory Visit (INDEPENDENT_AMBULATORY_CARE_PROVIDER_SITE_OTHER): Payer: No Typology Code available for payment source

## 2021-09-14 ENCOUNTER — Encounter: Payer: Self-pay | Admitting: Allergy

## 2021-09-14 DIAGNOSIS — J309 Allergic rhinitis, unspecified: Secondary | ICD-10-CM | POA: Diagnosis not present

## 2021-09-20 NOTE — Telephone Encounter (Signed)
Received email from [place] "[place],  Request was received and [facility] PCP was alerted for new auth on 09/15/21.  PCP has not entered new order yet.  I did send him another alert just now.  Please note I have attached updated copy of [facility] auth - starts date of first appt - his current Josem Kaufmann is valid until 10/11/21.  I will continue to work on getting new auth, but please note 09/07/21 and 09/14/21 visits are covered under current auth.     Thanks  Angelica Pou, RN"    I have updated patient's current authorization & will keep an eye out for new authorization.

## 2021-09-20 NOTE — Telephone Encounter (Signed)
Contacted [place], [phone] ext [phone], Spoke to [place] who stated they had not received authorization request for patient. Steven Benson stated they were behind on checking the faxes. I have refaxed renewal of authorization request to [facility], [phone] & emailed it to [email or web address]

## 2021-09-21 ENCOUNTER — Ambulatory Visit (INDEPENDENT_AMBULATORY_CARE_PROVIDER_SITE_OTHER): Payer: No Typology Code available for payment source | Admitting: Allergy & Immunology

## 2021-09-21 ENCOUNTER — Ambulatory Visit (INDEPENDENT_AMBULATORY_CARE_PROVIDER_SITE_OTHER): Payer: No Typology Code available for payment source

## 2021-09-21 ENCOUNTER — Encounter: Payer: Self-pay | Admitting: Allergy & Immunology

## 2021-09-21 ENCOUNTER — Other Ambulatory Visit: Payer: Self-pay

## 2021-09-21 VITALS — BP 130/80 | HR 67 | Temp 97.6°F | Resp 20 | Ht 73.0 in | Wt 218.4 lb

## 2021-09-21 DIAGNOSIS — J3089 Other allergic rhinitis: Secondary | ICD-10-CM | POA: Diagnosis not present

## 2021-09-21 DIAGNOSIS — J309 Allergic rhinitis, unspecified: Secondary | ICD-10-CM | POA: Diagnosis not present

## 2021-09-21 DIAGNOSIS — J302 Other seasonal allergic rhinitis: Secondary | ICD-10-CM

## 2021-09-21 MED ORDER — FLUTICASONE PROPIONATE 50 MCG/ACT NA SUSP: 2.0000 | Freq: Every day | NASAL | 11 refills | Status: AC | PRN

## 2021-09-21 MED ORDER — AZELASTINE HCL 0.1 % NA SOLN: NASAL | 11 refills | Status: AC

## 2021-09-21 MED ORDER — CETIRIZINE HCL 10 MG PO TABS: 10.0000 mg | ORAL_TABLET | Freq: Every evening | ORAL | 11 refills | Status: AC

## 2021-09-21 NOTE — Progress Notes (Signed)
FOLLOW UP  Date of Service/Encounter:  09/21/21   Assessment:   Seasonal and perennial allergic rhinitis (grasses, ragweed, weeds, trees, indoor molds, outdoor molds, cat and dog) - doing well on allergen immunotherapy   Worsening sinus symptoms - likely due to increase in tree pollen with the warmer weather   Nasal septal deviation and septal spur    Plan/Recommendations:   1. Chronic rhinitis (grasses, ragweed, weeds, trees, indoor molds, outdoor molds, cat and dog) - Continue with allergies at the same schedule.  - Continue taking: Flonase two sprays per nostril up to twice daily - Continue taking: Astelin (azelastine) 2 sprays per nostril 1-2 times daily as needed - Continue taking: cetirizine 10mg  up to twice daily - Prednisone pack provided to start in case symptoms do not get better.  - You can use an extra dose of the antihistamine, if needed, for breakthrough symptoms.  - Consider nasal saline rinses 1-2 times daily to remove allergens from the nasal cavities as well as help with mucous clearance (this is especially helpful to do before the nasal sprays are given)  2. Return in about 1 year (around 09/21/2022).    Subjective:   Steven Benson. is a 56 y.o. male presenting today for follow up of  Chief Complaint  Patient presents with   Follow-up   Allergic Rhinitis     About the same. Bad yesterday and stuffiness and post nasal drip switching from left to right nostril.     56. has a history of the following: Patient Active Problem List   Diagnosis Date Noted   Seasonal and perennial allergic rhinitis 10/11/2020    History obtained from: chart review and patient.  Steven Benson is a 56 y.o. male presenting for a follow up visit.  He was last seen around 1 year ago.  He had testing that was positive to grasses, ragweed, weeds, trees, indoor and outdoor molds, cat, and dog.  We continued his current nasal steroid and started Astelin as well.  He  did decide to start allergen immunotherapy.  Since last visit, he has done very well.   Steven Benson is on allergen immunotherapy. He receives two injections. Immunotherapy script #1 contains trees, grasses, cat, and dog. He currently receives 0.50mL of the RED vial (1/100). Immunotherapy script #2 contains molds and ragweed. He currently receives 0.37mL of the RED vial (1/100). He started shots March of 2022 and reached maintenance in August of 2022.  His worse time of the year is the spring. He had congestion that started yesterday. He tries to use it every day but this is not always the case.   Work is going well. He does 4 10 hour shifts with [employer]. He seems to like the work, but is frustrated about the lack of sick days.   He has a 16yo son who is wanting to learn to drive. He lives with his mother who is living down in [place].  Otherwise, there have been no changes to his past medical history, surgical history, family history, or social history.    Review of Systems  Constitutional: Negative.  Negative for fever, malaise/fatigue and weight loss.  HENT:  Positive for congestion. Negative for ear discharge and ear pain.   Eyes:  Negative for pain, discharge and redness.  Respiratory:  Negative for cough, sputum production, shortness of breath and wheezing.   Cardiovascular: Negative.  Negative for chest pain and palpitations.  Gastrointestinal:  Negative for abdominal pain, heartburn, nausea  and vomiting.  Skin: Negative.  Negative for itching and rash.  Neurological:  Negative for dizziness and headaches.  Endo/Heme/Allergies:  Negative for environmental allergies. Does not bruise/bleed easily.      Objective:   Blood pressure 130/80, pulse 67, temperature 97.6 F (36.4 C), temperature source Temporal, resp. rate 20, height 6\' 1"  (1.854 m), weight 218 lb 6.4 oz (99.1 kg), SpO2 97 %. Body mass index is 28.81 kg/m.   Physical Exam:  Physical Exam Vitals reviewed.   Constitutional:      Appearance: He is well-developed.  HENT:     Head: Normocephalic and atraumatic.     Right Ear: Tympanic membrane, ear canal and external ear normal.     Left Ear: Tympanic membrane, ear canal and external ear normal.     Nose: No nasal deformity, septal deviation, mucosal edema or rhinorrhea.     Right Turbinates: Enlarged, swollen and pale.     Left Turbinates: Enlarged, swollen and pale.     Right Sinus: No maxillary sinus tenderness or frontal sinus tenderness.     Left Sinus: No maxillary sinus tenderness or frontal sinus tenderness.     Mouth/Throat:     Mouth: Mucous membranes are not pale and not dry.     Pharynx: Uvula midline.  Eyes:     General: Lids are normal. No allergic shiner.       Right eye: No discharge.        Left eye: No discharge.     Conjunctiva/sclera: Conjunctivae normal.     Right eye: Right conjunctiva is not injected. No chemosis.    Left eye: Left conjunctiva is not injected. No chemosis.    Pupils: Pupils are equal, round, and reactive to light.  Cardiovascular:     Rate and Rhythm: Normal rate and regular rhythm.     Heart sounds: Normal heart sounds.  Pulmonary:     Effort: Pulmonary effort is normal. No tachypnea, accessory muscle usage or respiratory distress.     Breath sounds: Normal breath sounds. No wheezing, rhonchi or rales.  Chest:     Chest wall: No tenderness.  Lymphadenopathy:     Cervical: No cervical adenopathy.  Skin:    Coloration: Skin is not pale.     Findings: No abrasion, erythema, petechiae or rash. Rash is not papular, urticarial or vesicular.  Neurological:     Mental Status: He is alert.  Psychiatric:        Behavior: Behavior is cooperative.     Diagnostic studies: none    , MD  [facility]

## 2021-09-21 NOTE — Patient Instructions (Addendum)
1. Chronic rhinitis (grasses, ragweed, weeds, trees, indoor molds, outdoor molds, cat and dog) - Continue with allergies at the same schedule.  - Continue taking: Flonase two sprays per nostril up to twice daily - Continue taking: Astelin (azelastine) 2 sprays per nostril 1-2 times daily as needed - Continue taking: cetirizine 10mg  up to twice daily - Prednisone pack provided to start in case symptoms do not get better.  - You can use an extra dose of the antihistamine, if needed, for breakthrough symptoms.  - Consider nasal saline rinses 1-2 times daily to remove allergens from the nasal cavities as well as help with mucous clearance (this is especially helpful to do before the nasal sprays are given)  2. Return in about 1 year (around 09/21/2022).    Please inform 11/20/2022 of any Emergency Department visits, hospitalizations, or changes in symptoms. Call us before going to the ED for breathing or allergy symptoms since we might be able to fit you in for a sick visit. Feel free to contact us anytime with any questions, problems, or concerns.  It was a pleasure to see you again today!  Websites that have reliable patient information: 1. American Academy of Asthma, Allergy, and Immunology: www.aaaai.org 2. Food Allergy Research and Education (FARE): foodallergy.org 3. Mothers of Asthmatics: http://www.asthmacommunitynetwork.org 4. American College of Allergy, Asthma, and Immunology: www.acaai.org   COVID-19 Vaccine Information can be found at: [place] For questions related to vaccine distribution or appointments, please email [email or web address] or call [phone].   We realize that you might be concerned about having an allergic reaction to the COVID19 vaccines. To help with that concern, WE ARE OFFERING THE COVID19 VACCINES IN OUR OFFICE! Ask the front desk for dates!     Like [phone] on [place] and Instagram for our  latest updates!      A healthy democracy works best when [employer] participate! Make sure you are registered to vote! If you have moved or changed any of your contact information, you will need to get this updated before voting!  In some cases, you MAY be able to register to vote online: [employer]

## 2021-09-27 DIAGNOSIS — J3081 Allergic rhinitis due to animal (cat) (dog) hair and dander: Secondary | ICD-10-CM

## 2021-09-27 NOTE — Progress Notes (Signed)
VIALS EXP 09-27-22 °

## 2021-09-28 ENCOUNTER — Ambulatory Visit (INDEPENDENT_AMBULATORY_CARE_PROVIDER_SITE_OTHER): Payer: No Typology Code available for payment source | Admitting: *Deleted

## 2021-09-28 ENCOUNTER — Encounter: Payer: Self-pay | Admitting: Allergy & Immunology

## 2021-09-28 DIAGNOSIS — J309 Allergic rhinitis, unspecified: Secondary | ICD-10-CM

## 2021-09-29 DIAGNOSIS — J302 Other seasonal allergic rhinitis: Secondary | ICD-10-CM

## 2021-10-12 ENCOUNTER — Ambulatory Visit (INDEPENDENT_AMBULATORY_CARE_PROVIDER_SITE_OTHER): Payer: No Typology Code available for payment source | Admitting: *Deleted

## 2021-10-12 ENCOUNTER — Encounter: Payer: Self-pay | Admitting: Allergy & Immunology

## 2021-10-12 DIAGNOSIS — J309 Allergic rhinitis, unspecified: Secondary | ICD-10-CM | POA: Diagnosis not present

## 2021-10-18 NOTE — Telephone Encounter (Signed)
Steven Benson, ?Any update on new referral? I saw the updated one and it expired on 10/11/21; his injection was 10/12/21, so it won't cover that injection. ?[name] ?

## 2021-10-19 ENCOUNTER — Ambulatory Visit (INDEPENDENT_AMBULATORY_CARE_PROVIDER_SITE_OTHER): Payer: No Typology Code available for payment source

## 2021-10-19 DIAGNOSIS — J309 Allergic rhinitis, unspecified: Secondary | ICD-10-CM

## 2021-10-26 ENCOUNTER — Encounter: Payer: Self-pay | Admitting: Allergy

## 2021-10-26 ENCOUNTER — Ambulatory Visit (INDEPENDENT_AMBULATORY_CARE_PROVIDER_SITE_OTHER): Payer: No Typology Code available for payment source

## 2021-10-26 DIAGNOSIS — J309 Allergic rhinitis, unspecified: Secondary | ICD-10-CM

## 2021-11-02 ENCOUNTER — Ambulatory Visit (INDEPENDENT_AMBULATORY_CARE_PROVIDER_SITE_OTHER): Payer: No Typology Code available for payment source

## 2021-11-02 DIAGNOSIS — J309 Allergic rhinitis, unspecified: Secondary | ICD-10-CM

## 2021-11-09 ENCOUNTER — Encounter: Payer: Self-pay | Admitting: Allergy

## 2021-11-09 ENCOUNTER — Ambulatory Visit (INDEPENDENT_AMBULATORY_CARE_PROVIDER_SITE_OTHER): Payer: No Typology Code available for payment source

## 2021-11-09 DIAGNOSIS — J309 Allergic rhinitis, unspecified: Secondary | ICD-10-CM | POA: Diagnosis not present

## 2021-11-16 ENCOUNTER — Ambulatory Visit (INDEPENDENT_AMBULATORY_CARE_PROVIDER_SITE_OTHER): Payer: No Typology Code available for payment source

## 2021-11-16 ENCOUNTER — Encounter: Payer: Self-pay | Admitting: Allergy & Immunology

## 2021-11-16 DIAGNOSIS — J309 Allergic rhinitis, unspecified: Secondary | ICD-10-CM | POA: Diagnosis not present

## 2021-11-22 NOTE — Telephone Encounter (Signed)
[place], [phone] ext [phone], spoke to representative who stated authorization had been approved for the dates of 11-01-2021 to 11-01-2022.  ? ?Patient's last authorization expired 10-11-2021, new authorization will not cover injections on 10-12-2021, 10-19-2021, 10-26-2021, 11-02-2021, 11-09-2021, 11-16-2021.  ? ?I called and spoke to the nurse navigator, Idalia Needle, [phone] and she was able to fix dates of new authorization, [id number]. The new dates for authorization are 10-12-2021 - 10-12-2022. I have updated this in our system.  ?

## 2021-11-23 ENCOUNTER — Ambulatory Visit (INDEPENDENT_AMBULATORY_CARE_PROVIDER_SITE_OTHER): Payer: No Typology Code available for payment source

## 2021-11-23 ENCOUNTER — Encounter: Payer: Self-pay | Admitting: Allergy

## 2021-11-23 DIAGNOSIS — J309 Allergic rhinitis, unspecified: Secondary | ICD-10-CM | POA: Diagnosis not present

## 2021-12-07 ENCOUNTER — Ambulatory Visit (INDEPENDENT_AMBULATORY_CARE_PROVIDER_SITE_OTHER): Payer: No Typology Code available for payment source

## 2021-12-07 ENCOUNTER — Encounter: Payer: Self-pay | Admitting: Allergy & Immunology

## 2021-12-07 ENCOUNTER — Encounter: Payer: Self-pay | Admitting: Allergy

## 2021-12-07 DIAGNOSIS — J309 Allergic rhinitis, unspecified: Secondary | ICD-10-CM | POA: Diagnosis not present

## 2021-12-21 ENCOUNTER — Ambulatory Visit (INDEPENDENT_AMBULATORY_CARE_PROVIDER_SITE_OTHER): Payer: No Typology Code available for payment source

## 2021-12-21 ENCOUNTER — Encounter: Payer: Self-pay | Admitting: Allergy & Immunology

## 2021-12-21 DIAGNOSIS — J309 Allergic rhinitis, unspecified: Secondary | ICD-10-CM

## 2022-01-05 ENCOUNTER — Ambulatory Visit (INDEPENDENT_AMBULATORY_CARE_PROVIDER_SITE_OTHER): Payer: No Typology Code available for payment source

## 2022-01-05 ENCOUNTER — Encounter: Payer: Self-pay | Admitting: Allergy

## 2022-01-05 DIAGNOSIS — J309 Allergic rhinitis, unspecified: Secondary | ICD-10-CM

## 2022-01-10 DIAGNOSIS — J3081 Allergic rhinitis due to animal (cat) (dog) hair and dander: Secondary | ICD-10-CM

## 2022-01-10 NOTE — Progress Notes (Signed)
VIALS EXP 01-11-23 

## 2022-01-11 DIAGNOSIS — J301 Allergic rhinitis due to pollen: Secondary | ICD-10-CM

## 2022-01-18 ENCOUNTER — Ambulatory Visit (INDEPENDENT_AMBULATORY_CARE_PROVIDER_SITE_OTHER): Payer: No Typology Code available for payment source

## 2022-01-18 ENCOUNTER — Encounter: Payer: Self-pay | Admitting: Allergy

## 2022-01-18 DIAGNOSIS — J309 Allergic rhinitis, unspecified: Secondary | ICD-10-CM | POA: Diagnosis not present

## 2022-02-02 ENCOUNTER — Ambulatory Visit (INDEPENDENT_AMBULATORY_CARE_PROVIDER_SITE_OTHER): Payer: No Typology Code available for payment source | Admitting: *Deleted

## 2022-02-02 ENCOUNTER — Encounter: Payer: Self-pay | Admitting: Allergy

## 2022-02-02 DIAGNOSIS — J309 Allergic rhinitis, unspecified: Secondary | ICD-10-CM

## 2022-02-16 ENCOUNTER — Ambulatory Visit (INDEPENDENT_AMBULATORY_CARE_PROVIDER_SITE_OTHER): Payer: No Typology Code available for payment source | Admitting: *Deleted

## 2022-02-16 ENCOUNTER — Encounter: Payer: Self-pay | Admitting: Allergy

## 2022-02-16 DIAGNOSIS — J309 Allergic rhinitis, unspecified: Secondary | ICD-10-CM

## 2022-03-01 ENCOUNTER — Ambulatory Visit (INDEPENDENT_AMBULATORY_CARE_PROVIDER_SITE_OTHER): Payer: No Typology Code available for payment source

## 2022-03-01 ENCOUNTER — Encounter: Payer: Self-pay | Admitting: Allergy

## 2022-03-01 DIAGNOSIS — J309 Allergic rhinitis, unspecified: Secondary | ICD-10-CM | POA: Diagnosis not present

## 2022-03-09 ENCOUNTER — Ambulatory Visit (INDEPENDENT_AMBULATORY_CARE_PROVIDER_SITE_OTHER): Payer: No Typology Code available for payment source

## 2022-03-09 ENCOUNTER — Encounter: Payer: Self-pay | Admitting: Allergy

## 2022-03-09 DIAGNOSIS — J309 Allergic rhinitis, unspecified: Secondary | ICD-10-CM

## 2022-03-16 ENCOUNTER — Ambulatory Visit (INDEPENDENT_AMBULATORY_CARE_PROVIDER_SITE_OTHER): Payer: No Typology Code available for payment source

## 2022-03-16 ENCOUNTER — Encounter: Payer: Self-pay | Admitting: Allergy

## 2022-03-16 DIAGNOSIS — J309 Allergic rhinitis, unspecified: Secondary | ICD-10-CM | POA: Diagnosis not present

## 2022-03-22 ENCOUNTER — Ambulatory Visit (INDEPENDENT_AMBULATORY_CARE_PROVIDER_SITE_OTHER): Payer: No Typology Code available for payment source

## 2022-03-22 DIAGNOSIS — J309 Allergic rhinitis, unspecified: Secondary | ICD-10-CM

## 2022-04-03 ENCOUNTER — Ambulatory Visit (INDEPENDENT_AMBULATORY_CARE_PROVIDER_SITE_OTHER): Payer: No Typology Code available for payment source | Admitting: *Deleted

## 2022-04-03 ENCOUNTER — Encounter: Payer: Self-pay | Admitting: Allergy and Immunology

## 2022-04-03 DIAGNOSIS — J309 Allergic rhinitis, unspecified: Secondary | ICD-10-CM

## 2022-04-26 ENCOUNTER — Encounter: Payer: Self-pay | Admitting: Allergy & Immunology

## 2022-04-26 ENCOUNTER — Ambulatory Visit (INDEPENDENT_AMBULATORY_CARE_PROVIDER_SITE_OTHER): Payer: No Typology Code available for payment source | Admitting: *Deleted

## 2022-04-26 DIAGNOSIS — J309 Allergic rhinitis, unspecified: Secondary | ICD-10-CM | POA: Diagnosis not present

## 2022-05-11 ENCOUNTER — Ambulatory Visit (INDEPENDENT_AMBULATORY_CARE_PROVIDER_SITE_OTHER): Payer: No Typology Code available for payment source | Admitting: *Deleted

## 2022-05-11 ENCOUNTER — Encounter: Payer: Self-pay | Admitting: Allergy

## 2022-05-11 DIAGNOSIS — J309 Allergic rhinitis, unspecified: Secondary | ICD-10-CM | POA: Diagnosis not present

## 2022-05-24 ENCOUNTER — Ambulatory Visit (INDEPENDENT_AMBULATORY_CARE_PROVIDER_SITE_OTHER): Payer: No Typology Code available for payment source

## 2022-05-24 DIAGNOSIS — J309 Allergic rhinitis, unspecified: Secondary | ICD-10-CM

## 2022-06-08 ENCOUNTER — Encounter: Payer: Self-pay | Admitting: Internal Medicine

## 2022-06-08 ENCOUNTER — Ambulatory Visit (INDEPENDENT_AMBULATORY_CARE_PROVIDER_SITE_OTHER): Payer: No Typology Code available for payment source

## 2022-06-08 DIAGNOSIS — J309 Allergic rhinitis, unspecified: Secondary | ICD-10-CM

## 2022-06-25 ENCOUNTER — Encounter: Payer: Self-pay | Admitting: Family

## 2022-06-25 ENCOUNTER — Ambulatory Visit (INDEPENDENT_AMBULATORY_CARE_PROVIDER_SITE_OTHER): Payer: No Typology Code available for payment source | Admitting: *Deleted

## 2022-06-25 DIAGNOSIS — J309 Allergic rhinitis, unspecified: Secondary | ICD-10-CM

## 2022-07-04 ENCOUNTER — Ambulatory Visit (INDEPENDENT_AMBULATORY_CARE_PROVIDER_SITE_OTHER): Payer: No Typology Code available for payment source | Admitting: *Deleted

## 2022-07-04 ENCOUNTER — Encounter: Payer: Self-pay | Admitting: Family Medicine

## 2022-07-04 DIAGNOSIS — J309 Allergic rhinitis, unspecified: Secondary | ICD-10-CM | POA: Diagnosis not present

## 2022-07-11 DIAGNOSIS — J3081 Allergic rhinitis due to animal (cat) (dog) hair and dander: Secondary | ICD-10-CM

## 2022-07-11 NOTE — Progress Notes (Signed)
VIALS EXP 07-12-23 

## 2022-07-12 DIAGNOSIS — J301 Allergic rhinitis due to pollen: Secondary | ICD-10-CM

## 2022-07-20 ENCOUNTER — Encounter: Payer: Self-pay | Admitting: Internal Medicine

## 2022-07-20 ENCOUNTER — Ambulatory Visit (INDEPENDENT_AMBULATORY_CARE_PROVIDER_SITE_OTHER): Payer: No Typology Code available for payment source | Admitting: *Deleted

## 2022-07-20 DIAGNOSIS — J309 Allergic rhinitis, unspecified: Secondary | ICD-10-CM

## 2022-08-16 ENCOUNTER — Encounter: Payer: Self-pay | Admitting: Allergy & Immunology

## 2022-08-16 ENCOUNTER — Ambulatory Visit (INDEPENDENT_AMBULATORY_CARE_PROVIDER_SITE_OTHER): Payer: No Typology Code available for payment source

## 2022-08-16 DIAGNOSIS — J309 Allergic rhinitis, unspecified: Secondary | ICD-10-CM

## 2022-08-23 ENCOUNTER — Encounter: Payer: Self-pay | Admitting: Allergy

## 2022-08-23 ENCOUNTER — Ambulatory Visit (INDEPENDENT_AMBULATORY_CARE_PROVIDER_SITE_OTHER): Payer: No Typology Code available for payment source

## 2022-08-23 DIAGNOSIS — J309 Allergic rhinitis, unspecified: Secondary | ICD-10-CM | POA: Diagnosis not present

## 2022-08-31 ENCOUNTER — Encounter: Payer: Self-pay | Admitting: Internal Medicine

## 2022-08-31 ENCOUNTER — Ambulatory Visit (INDEPENDENT_AMBULATORY_CARE_PROVIDER_SITE_OTHER): Payer: No Typology Code available for payment source

## 2022-08-31 DIAGNOSIS — J309 Allergic rhinitis, unspecified: Secondary | ICD-10-CM

## 2022-09-06 ENCOUNTER — Ambulatory Visit (INDEPENDENT_AMBULATORY_CARE_PROVIDER_SITE_OTHER): Payer: No Typology Code available for payment source

## 2022-09-06 ENCOUNTER — Encounter: Payer: Self-pay | Admitting: Allergy & Immunology

## 2022-09-06 DIAGNOSIS — J309 Allergic rhinitis, unspecified: Secondary | ICD-10-CM | POA: Diagnosis not present

## 2022-09-14 ENCOUNTER — Ambulatory Visit (INDEPENDENT_AMBULATORY_CARE_PROVIDER_SITE_OTHER): Payer: No Typology Code available for payment source | Admitting: *Deleted

## 2022-09-14 ENCOUNTER — Telehealth: Payer: Self-pay | Admitting: Allergy & Immunology

## 2022-09-14 ENCOUNTER — Encounter: Payer: Self-pay | Admitting: Allergy

## 2022-09-14 DIAGNOSIS — J309 Allergic rhinitis, unspecified: Secondary | ICD-10-CM

## 2022-09-14 NOTE — Telephone Encounter (Signed)
Patient's current authorization ([id number]) exp 10-12-2022.   Faxed renewal of authorization request to [facility], [phone] and emailed it to [email or web address].   Patient stopped by front desk to get letter for today's injection. Advised patient his authorization expires 10-12-2022 and to reach out to [facility] as well. Patient verbalized understanding.

## 2022-09-28 ENCOUNTER — Ambulatory Visit (INDEPENDENT_AMBULATORY_CARE_PROVIDER_SITE_OTHER): Payer: No Typology Code available for payment source | Admitting: Family Medicine

## 2022-09-28 ENCOUNTER — Encounter: Payer: Self-pay | Admitting: Family Medicine

## 2022-09-28 ENCOUNTER — Other Ambulatory Visit: Payer: Self-pay

## 2022-09-28 VITALS — BP 120/90 | HR 62 | Temp 98.4°F | Resp 16 | Ht 73.0 in | Wt 211.2 lb

## 2022-09-28 DIAGNOSIS — J309 Allergic rhinitis, unspecified: Secondary | ICD-10-CM

## 2022-09-28 DIAGNOSIS — J302 Other seasonal allergic rhinitis: Secondary | ICD-10-CM

## 2022-09-28 MED ORDER — EPINEPHRINE 0.3 MG/0.3ML IJ SOAJ: INTRAMUSCULAR | 1 refills | Status: AC

## 2022-09-28 NOTE — Patient Instructions (Addendum)
1. Chronic rhinitis (grasses, ragweed, weeds, trees, indoor molds, outdoor molds, cat and dog) - Continue with allergen immunotherapy at the same schedule and have access to an epinephrine auto-injector set - Continue taking: Flonase two sprays per nostril up to twice daily - Continue taking: Astelin (azelastine) 2 sprays per nostril 1-2 times daily as needed - Continue taking: cetirizine 38m up to twice daily - You can use an extra dose of the antihistamine, if needed, for breakthrough symptoms.  - Consider nasal saline rinses 1-2 times daily to remove allergens from the nasal cavities as well as help with mucous clearance (this is especially helpful to do before the nasal sprays are given)  2. Follow up in 1 year or sooner if needed   Please inform u[place]of any Emergency Department visits, hospitalizations, or changes in symptoms. Call [place] going to the ED for breathing or allergy symptoms since we might be able to fit you in for a sick visit. Feel free to contact u[place]anytime with any questions, problems, or concerns.  It was a pleasure to meet you today!

## 2022-09-28 NOTE — Progress Notes (Signed)
[name] [address] Dept: [phone]  FOLLOW UP NOTE  Patient ID: Steven Benson., male    DOB: 08-10-1966  Age: 57 y.o. MRN: [id number] Date of Office Visit: 09/28/2022  Assessment  Chief Complaint: Follow-up (Follow no concerns)  HPI Steven Benson. is a 57 year old male who presents to the clinic for follow-up visit.  He was last seen in this clinic on 09/21/2021 by Dr. Ernst Benson for evaluation of allergic rhinitis with septal deviation and septal spur.    At today's visit, he reports his allergic rhinitis has been moderately well-controlled with symptoms including clear rhinorrhea, occasional nasal congestion, occasional sneezing, and a moderate amount of postnasal drainage.  He continues cetirizine 10 mg once a day, Flonase and azelastine as needed and is not currently using a nasal saline rinse. He began allergen immunotherapy directed toward grass pollen, tree pollen, cat, dog, mold, and ragweed on 11/04/2020.  He denies large or local reactions associated with allergen immunotherapy.  He reports a moderate decrease in his symptoms of allergic rhinitis while continuing on allergen immunotherapy.  He last saw Dr. Redmond Benson, ENT specialist, on 09/11/2020. Prior to that visit, he had been seeing Dr. Vicente Benson, ENT, with recommendation for septoplasty/bilateral inferior turbinate reduction. The patient is interested in continuing allergen immunotherapy and treating with medications at this time in an effort to avoid surgery.  His current medications are listed in the chart.     Drug Allergies:  Allergies  Allergen Reactions   Accupril [Quinapril Hcl] Anaphylaxis and Swelling    Swelling of lips   Latex Rash   Tape Itching and Rash    Physical Exam: BP (!) 120/90   Pulse 62   Temp 98.4 F (36.9 C)   Resp 16   Ht 6' 1"$  (1.854 m)   Wt 211 lb 3.2 oz (95.8 kg)   SpO2 100%   BMI 27.86 kg/m    Physical Exam Vitals reviewed.  Constitutional:      Appearance:  Normal appearance.  HENT:     Head: Normocephalic and atraumatic.     Right Ear: Tympanic membrane normal.     Left Ear: Tympanic membrane normal.     Nose:     Comments: Bilateral nares edematous and pale with clear nasal drainage noted.  Pharynx normal.  Ears normal.  Eyes normal.    Mouth/Throat:     Pharynx: Oropharynx is clear.  Eyes:     Conjunctiva/sclera: Conjunctivae normal.  Cardiovascular:     Rate and Rhythm: Normal rate and regular rhythm.     Heart sounds: Normal heart sounds. No murmur heard. Pulmonary:     Effort: Pulmonary effort is normal.     Breath sounds: Normal breath sounds.     Comments: Lungs clear to auscultation Musculoskeletal:        General: Normal range of motion.     Cervical back: Normal range of motion and neck supple.  Skin:    General: Skin is warm and dry.  Neurological:     Mental Status: He is alert and oriented to person, place, and time.  Psychiatric:        Mood and Affect: Mood normal.        Behavior: Behavior normal.        Thought Content: Thought content normal.        Judgment: Judgment normal.     Assessment and Plan: 1. Seasonal and perennial allergic rhinitis     Meds ordered  this encounter  Medications   EPINEPHrine 0.3 mg/0.3 mL IJ SOAJ injection    Sig: INJECT 0.3 ML INTRAMUSCULARLY AS DIRECTED FOR 1 DOSE    Dispense:  1 each    Refill:  1    Patient Instructions  1. Chronic rhinitis (grasses, ragweed, weeds, trees, indoor molds, outdoor molds, cat and dog) - Continue with allergen immunotherapy at the same schedule and have access to an epinephrine auto-injector set - Continue taking: Flonase two sprays per nostril up to twice daily - Continue taking: Astelin (azelastine) 2 sprays per nostril 1-2 times daily as needed - Continue taking: cetirizine 41m up to twice daily - You can use an extra dose of the antihistamine, if needed, for breakthrough symptoms.  - Consider nasal saline rinses 1-2 times daily to  remove allergens from the nasal cavities as well as help with mucous clearance (this is especially helpful to do before the nasal sprays are given)  2. Follow up in 1 year or sooner if needed   Please inform u[place]of any Emergency Department visits, hospitalizations, or changes in symptoms. Call [place]efore going to the ED for breathing or allergy symptoms since we might be able to fit you in for a sick visit. Feel free to contact u[place]anytime with any questions, problems, or concerns.  It was a pleasure to meet you today!  Return in about 1 year (around 09/29/2023), or if symptoms worsen or fail to improve.    Thank you for the opportunity to care for this patient.  Please do not hesitate to contact me with questions.  AGareth Morgan FNP [facility]

## 2022-10-01 NOTE — Telephone Encounter (Signed)
Received new authorization [id number]) with preliminary dates of 09-26-2022 to 09-26-2023.   New authorization has been put in system and indexed into media under AMB Referral - [place] AUTH EXP 09-26-2023.   Patients current authorization [id number]) does not expire until 10-12-2022. Once current authorization expires new authorization can be used for appointments and injections.

## 2022-10-12 ENCOUNTER — Ambulatory Visit (INDEPENDENT_AMBULATORY_CARE_PROVIDER_SITE_OTHER): Payer: No Typology Code available for payment source | Admitting: *Deleted

## 2022-10-12 ENCOUNTER — Encounter: Payer: Self-pay | Admitting: Allergy

## 2022-10-12 DIAGNOSIS — J309 Allergic rhinitis, unspecified: Secondary | ICD-10-CM

## 2022-10-26 ENCOUNTER — Encounter: Payer: Self-pay | Admitting: Family Medicine

## 2022-10-26 ENCOUNTER — Ambulatory Visit (INDEPENDENT_AMBULATORY_CARE_PROVIDER_SITE_OTHER): Payer: No Typology Code available for payment source

## 2022-10-26 DIAGNOSIS — J309 Allergic rhinitis, unspecified: Secondary | ICD-10-CM | POA: Diagnosis not present

## 2022-11-08 ENCOUNTER — Encounter: Payer: Self-pay | Admitting: Allergy

## 2022-11-08 ENCOUNTER — Ambulatory Visit (INDEPENDENT_AMBULATORY_CARE_PROVIDER_SITE_OTHER): Payer: No Typology Code available for payment source

## 2022-11-08 DIAGNOSIS — J309 Allergic rhinitis, unspecified: Secondary | ICD-10-CM | POA: Diagnosis not present

## 2022-11-22 ENCOUNTER — Ambulatory Visit (INDEPENDENT_AMBULATORY_CARE_PROVIDER_SITE_OTHER): Payer: No Typology Code available for payment source

## 2022-11-22 ENCOUNTER — Encounter: Payer: Self-pay | Admitting: Allergy & Immunology

## 2022-11-22 DIAGNOSIS — J309 Allergic rhinitis, unspecified: Secondary | ICD-10-CM

## 2022-11-27 DIAGNOSIS — J3081 Allergic rhinitis due to animal (cat) (dog) hair and dander: Secondary | ICD-10-CM | POA: Diagnosis not present

## 2022-11-27 NOTE — Progress Notes (Signed)
VIALS EXP 11-27-23 

## 2022-11-28 DIAGNOSIS — J301 Allergic rhinitis due to pollen: Secondary | ICD-10-CM | POA: Diagnosis not present

## 2022-12-07 ENCOUNTER — Encounter: Payer: Self-pay | Admitting: Family Medicine

## 2022-12-07 ENCOUNTER — Ambulatory Visit (INDEPENDENT_AMBULATORY_CARE_PROVIDER_SITE_OTHER): Payer: No Typology Code available for payment source

## 2022-12-07 DIAGNOSIS — J309 Allergic rhinitis, unspecified: Secondary | ICD-10-CM | POA: Diagnosis not present

## 2022-12-13 ENCOUNTER — Ambulatory Visit (INDEPENDENT_AMBULATORY_CARE_PROVIDER_SITE_OTHER): Payer: No Typology Code available for payment source

## 2022-12-13 DIAGNOSIS — J309 Allergic rhinitis, unspecified: Secondary | ICD-10-CM | POA: Diagnosis not present

## 2022-12-20 ENCOUNTER — Ambulatory Visit (INDEPENDENT_AMBULATORY_CARE_PROVIDER_SITE_OTHER): Payer: No Typology Code available for payment source

## 2022-12-20 ENCOUNTER — Encounter: Payer: Self-pay | Admitting: Allergy & Immunology

## 2022-12-20 DIAGNOSIS — J309 Allergic rhinitis, unspecified: Secondary | ICD-10-CM

## 2022-12-27 ENCOUNTER — Encounter: Payer: Self-pay | Admitting: Family Medicine

## 2022-12-27 ENCOUNTER — Ambulatory Visit (INDEPENDENT_AMBULATORY_CARE_PROVIDER_SITE_OTHER): Payer: No Typology Code available for payment source

## 2022-12-27 DIAGNOSIS — J309 Allergic rhinitis, unspecified: Secondary | ICD-10-CM

## 2023-01-03 ENCOUNTER — Encounter: Payer: Self-pay | Admitting: Family Medicine

## 2023-01-03 ENCOUNTER — Ambulatory Visit (INDEPENDENT_AMBULATORY_CARE_PROVIDER_SITE_OTHER): Payer: No Typology Code available for payment source

## 2023-01-03 DIAGNOSIS — J309 Allergic rhinitis, unspecified: Secondary | ICD-10-CM | POA: Diagnosis not present

## 2023-01-11 ENCOUNTER — Encounter: Payer: Self-pay | Admitting: Allergy

## 2023-01-11 ENCOUNTER — Ambulatory Visit (INDEPENDENT_AMBULATORY_CARE_PROVIDER_SITE_OTHER): Payer: No Typology Code available for payment source

## 2023-01-11 DIAGNOSIS — J309 Allergic rhinitis, unspecified: Secondary | ICD-10-CM

## 2023-02-01 ENCOUNTER — Ambulatory Visit (INDEPENDENT_AMBULATORY_CARE_PROVIDER_SITE_OTHER): Payer: No Typology Code available for payment source | Admitting: *Deleted

## 2023-02-01 ENCOUNTER — Encounter: Payer: Self-pay | Admitting: Internal Medicine

## 2023-02-01 DIAGNOSIS — J309 Allergic rhinitis, unspecified: Secondary | ICD-10-CM

## 2023-02-22 ENCOUNTER — Encounter: Payer: Self-pay | Admitting: Allergy

## 2023-02-22 ENCOUNTER — Ambulatory Visit (INDEPENDENT_AMBULATORY_CARE_PROVIDER_SITE_OTHER): Payer: No Typology Code available for payment source | Admitting: *Deleted

## 2023-02-22 DIAGNOSIS — J309 Allergic rhinitis, unspecified: Secondary | ICD-10-CM | POA: Diagnosis not present

## 2023-03-08 ENCOUNTER — Ambulatory Visit: Payer: 59 | Admitting: Dietician

## 2023-03-15 ENCOUNTER — Encounter: Payer: Self-pay | Admitting: Allergy

## 2023-03-15 ENCOUNTER — Ambulatory Visit (INDEPENDENT_AMBULATORY_CARE_PROVIDER_SITE_OTHER): Payer: No Typology Code available for payment source

## 2023-03-15 DIAGNOSIS — J309 Allergic rhinitis, unspecified: Secondary | ICD-10-CM

## 2023-04-05 ENCOUNTER — Encounter: Payer: Self-pay | Admitting: Family Medicine

## 2023-04-05 ENCOUNTER — Ambulatory Visit (INDEPENDENT_AMBULATORY_CARE_PROVIDER_SITE_OTHER): Payer: No Typology Code available for payment source | Admitting: *Deleted

## 2023-04-05 ENCOUNTER — Encounter: Payer: 59 | Attending: Internal Medicine | Admitting: Dietician

## 2023-04-05 ENCOUNTER — Encounter: Payer: Self-pay | Admitting: Dietician

## 2023-04-05 VITALS — Ht 73.0 in | Wt 209.5 lb

## 2023-04-05 DIAGNOSIS — J309 Allergic rhinitis, unspecified: Secondary | ICD-10-CM

## 2023-04-05 DIAGNOSIS — E639 Nutritional deficiency, unspecified: Secondary | ICD-10-CM | POA: Insufficient documentation

## 2023-04-05 NOTE — Progress Notes (Signed)
Medical Nutrition Therapy  Appointment Start time:  8:23  Appointment End time:  9:43  Primary concerns today: eat healthy; preventative to avoid diabetes, etc. Referral diagnosis: nutritional deficiency, unspecified  Preferred learning style: no preference indicated (auditory, visual, hands on, no preference indicated) Learning readiness: preparation (not ready, contemplating, ready, change in progress)  NUTRITION ASSESSMENT   Anthropometrics  Weight:  Height:  73 in  Body Composition Scale 04/05/2023  Current Body Weight 209.5  Total Body Fat % 17.4  Visceral Fat 13  Fat-Free Mass % 82.5   Total Body Water % 63.5  Muscle-Mass lbs 39.4  BMI 27.4  Body Fat Displacement          Torso  lbs 22.6         Left Leg  lbs 4.5         Right Leg  lbs 4.5         Left Arm  lbs 2.2         Right Arm  lbs 2.2   Clinical Medical Hx: HTN,  Medications: amlodipine, azelastine, cetirizine, fluticasone prop, hydrochlorothiazide, rosuvastatin CA, melatonin, sertraline HCL, topiramate, trazodone HCL Labs: glucose 72; HCT 38.1; HDL 62.6; WBC 3.11 Notable Signs/Symptoms: none noted  Lifestyle & Dietary Hx  Pt states he works on the [employer] for [employer]. Pt states he eats breakfast, and his co-workers stop and get a biscuit, which is tempting. Pt states he likes sweets and will finish what he has, even if there are two, stating he wants to complete/finish it.  Estimated daily fluid intake: 32-48 oz Supplements: Vit D Sleep: 5 hours a night Stress / self-care: watch TV to unwind Current average weekly physical activity: walking daily with work; walk on treadmill 60 minutes or outside daily. Stretching and push ups every morning  24-Hr Dietary Recall First Meal: oatmeal (plain), breakfast sandwich (bacon and egg), watermelon or blueberries or kiwi, hot tea Snack: pumpkin seeds Second Meal: doritos, watermelon or other fruit, beef jerky, two pieces of candy, bubblegum. Snack:  cake Third Meal: chicken wings and salad Snack: doritos or chips Beverages: water, juice, hot tea, lemonade  Estimated Energy Needs Calories: 2000  NUTRITION DIAGNOSIS  NB-1.1 Food and nutrition-related knowledge deficit As related to nutrition related information and guidelines.  As evidenced by PCP referral and patient requesting education.Marland Kitchen  NUTRITION INTERVENTION  Nutrition education (E-1) on the following topics:  Fruits & Vegetables: Aim to fill half your plate with a variety of fruits and vegetables. They are rich in vitamins, minerals, and fiber, and can help reduce the risk of chronic diseases. Choose a colorful assortment of fruits and vegetables to ensure you get a wide range of nutrients. Grains and Starches: Make at least half of your grain choices whole grains, such as Urvi Imes rice, whole wheat bread, and oats. Whole grains provide fiber, which aids in digestion and healthy cholesterol levels. Aim for whole forms of starchy vegetables such as potatoes, sweet potatoes, beans, peas, and corn, which are fiber rich and provide many vitamins and minerals.  Protein: Incorporate lean sources of protein, such as poultry, fish, beans, nuts, and seeds, into your meals. Protein is essential for building and repairing tissues, staying full, balancing blood sugar, as well as supporting immune function. Dairy: Include low-fat or fat-free dairy products like milk, yogurt, and cheese in your diet. Dairy foods are excellent sources of calcium and vitamin D, which are crucial for bone health.  Physical Activity: Aim for 150 minutes of physical activity  weekly. Regular physical activity promotes overall health-including helping to reduce risk for heart disease and diabetes, promoting mental health, and helping [place] sleep better.  Why you need complex carbohydrates: Whole grains and other complex carbohydrates are required to have a healthy diet. Whole grains provide fiber which can help with blood glucose  levels and help keep you satiated. Fruits and starchy vegetables provide essential vitamins and minerals required for immune function, eyesight support, brain support, bone density, wound healing and many other functions within the body. According to the current evidenced based 2020-2025 Dietary Guidelines for Americans, complex carbohydrates are part of a healthy eating pattern which is associated with a decreased risk for type 2 diabetes, cancers, and cardiovascular disease.  Health Benefits of Physical Activity Encouraged patient to honor their body's internal hunger and fullness cues.  Throughout the day, check in mentally and rate hunger. Stop eating when satisfied not full regardless of how much food is left on the plate.  Get more if still hungry 20-30 minutes later.  The key is to honor satisfaction so throughout the meal, rate fullness factor and stop when comfortably satisfied not physically full. The key is to honor hunger and fullness without any feelings of guilt or shame.  Pay attention to what the internal cues are, rather than any external factors. This will enhance the confidence you have in listening to your own body and following those internal cues enabling you to increase how often you eat when you are hungry not out of appetite and stop when you are satisfied not full.  Encouraged pt to continue to eat balanced meals inclusive of non starchy vegetables 2 times a day 7 days a week Encouraged pt to choose lean protein sources: limiting beef, pork, sausage, hotdogs, and lunch meat Encourage pt to choose healthy fats such as plant based limiting animal fats Encouraged pt to continue to drink a minium 64 fluid ounces with half being plain water to satisfy proper hydration    Handouts Provided Include  Types of Fat (saturated vs unsaturated) Meal Ideas Handout  Learning Style & Readiness for Change Teaching method utilized: Visual & Auditory  Demonstrated degree of understanding via:  Teach Back  Barriers to learning/adherence to lifestyle change: nothing identifired.  Goals Established by Pt Increase complex carbohydrates and non-starchy; aiming 25 grams of fiber a day. Continue physical activity, walking, stretching and push-up.  MONITORING & EVALUATION Dietary intake, weekly physical activity, body composition.  Next Steps  Patient is to return for follow-up in 2-3 months.

## 2023-04-26 ENCOUNTER — Ambulatory Visit (INDEPENDENT_AMBULATORY_CARE_PROVIDER_SITE_OTHER): Payer: No Typology Code available for payment source | Admitting: *Deleted

## 2023-04-26 ENCOUNTER — Encounter: Payer: Self-pay | Admitting: Allergy

## 2023-04-26 DIAGNOSIS — J309 Allergic rhinitis, unspecified: Secondary | ICD-10-CM

## 2023-04-30 DIAGNOSIS — J3081 Allergic rhinitis due to animal (cat) (dog) hair and dander: Secondary | ICD-10-CM | POA: Diagnosis not present

## 2023-04-30 NOTE — Progress Notes (Signed)
VIALS EXP 04-29-24

## 2023-05-01 DIAGNOSIS — J301 Allergic rhinitis due to pollen: Secondary | ICD-10-CM | POA: Diagnosis not present

## 2023-05-17 ENCOUNTER — Ambulatory Visit (INDEPENDENT_AMBULATORY_CARE_PROVIDER_SITE_OTHER): Payer: No Typology Code available for payment source | Admitting: *Deleted

## 2023-05-17 DIAGNOSIS — J309 Allergic rhinitis, unspecified: Secondary | ICD-10-CM | POA: Diagnosis not present

## 2023-06-11 ENCOUNTER — Ambulatory Visit (INDEPENDENT_AMBULATORY_CARE_PROVIDER_SITE_OTHER): Payer: No Typology Code available for payment source | Admitting: *Deleted

## 2023-06-11 DIAGNOSIS — J309 Allergic rhinitis, unspecified: Secondary | ICD-10-CM

## 2023-06-21 ENCOUNTER — Encounter: Payer: Self-pay | Admitting: Internal Medicine

## 2023-06-21 ENCOUNTER — Ambulatory Visit (INDEPENDENT_AMBULATORY_CARE_PROVIDER_SITE_OTHER): Payer: No Typology Code available for payment source

## 2023-06-21 DIAGNOSIS — J309 Allergic rhinitis, unspecified: Secondary | ICD-10-CM | POA: Diagnosis not present

## 2023-06-28 ENCOUNTER — Ambulatory Visit (INDEPENDENT_AMBULATORY_CARE_PROVIDER_SITE_OTHER): Payer: No Typology Code available for payment source | Admitting: *Deleted

## 2023-06-28 ENCOUNTER — Encounter: Payer: Self-pay | Admitting: Allergy

## 2023-06-28 DIAGNOSIS — J309 Allergic rhinitis, unspecified: Secondary | ICD-10-CM

## 2023-07-18 ENCOUNTER — Ambulatory Visit (INDEPENDENT_AMBULATORY_CARE_PROVIDER_SITE_OTHER): Payer: No Typology Code available for payment source | Admitting: *Deleted

## 2023-07-18 ENCOUNTER — Encounter: Payer: Self-pay | Admitting: Allergy & Immunology

## 2023-07-18 DIAGNOSIS — J309 Allergic rhinitis, unspecified: Secondary | ICD-10-CM | POA: Diagnosis not present

## 2023-07-26 ENCOUNTER — Ambulatory Visit: Payer: 59 | Admitting: Dietician

## 2023-08-23 ENCOUNTER — Encounter: Payer: Self-pay | Admitting: Internal Medicine

## 2023-08-23 ENCOUNTER — Ambulatory Visit (INDEPENDENT_AMBULATORY_CARE_PROVIDER_SITE_OTHER): Payer: No Typology Code available for payment source | Admitting: *Deleted

## 2023-08-23 DIAGNOSIS — J309 Allergic rhinitis, unspecified: Secondary | ICD-10-CM

## 2023-08-30 ENCOUNTER — Encounter: Payer: Self-pay | Admitting: Dietician

## 2023-08-30 ENCOUNTER — Encounter: Payer: 59 | Attending: Internal Medicine | Admitting: Dietician

## 2023-08-30 VITALS — Ht 73.0 in | Wt 216.1 lb

## 2023-08-30 DIAGNOSIS — E639 Nutritional deficiency, unspecified: Secondary | ICD-10-CM | POA: Insufficient documentation

## 2023-08-30 NOTE — Progress Notes (Signed)
Medical Nutrition Therapy  Appointment Start time:  [phone]  Appointment End time:  1012  Primary concerns today: eat healthy; preventative to avoid diabetes, etc. Referral diagnosis: nutritional deficiency, unspecified  Preferred learning style: no preference indicated (auditory, visual, hands on, no preference indicated) Learning readiness: preparation (not ready, contemplating, ready, change in progress)  NUTRITION ASSESSMENT   Anthropometrics  Weight: 216.1 lb Height:  73 in  Body Composition Scale 04/05/2023 08/30/2023  Current Body Weight 209.5 216.1  Total Body Fat % 17.4 23.4  Visceral Fat 13 14  Fat-Free Mass % 82.5 76.5   Total Body Water % 63.5 57.5  Muscle-Mass lbs 39.4 40.6  BMI 27.4 28.3  Body Fat Displacement           Torso  lbs 22.6 34.1         Left Leg  lbs 4.5 6.2         Right Leg  lbs 4.5 6.2         Left Arm  lbs 2.2 3.1         Right Arm  lbs 2.2 3.1   Clinical Medical Hx: HTN,  Medications: amlodipine, azelastine, cetirizine, fluticasone prop, hydrochlorothiazide, rosuvastatin CA, melatonin, sertraline HCL, topiramate, trazodone HCL Labs: glucose 72; HCT 38.1; HDL 62.6; WBC 3.11 Notable Signs/Symptoms: none noted  Lifestyle & Dietary Hx  Pt states he was on vacation all last month, stating he is trying to get into the swing of things. Pt states he did not exercise as much as he had been. Pt states he has been using macadamia nut oil for cooking. Pt asked many questions about specific foods.  Estimated daily fluid intake: 32-48 oz Supplements: Vit D Sleep: 5 hours a night Stress / self-care: watch TV to unwind Current average weekly physical activity: walking daily with work; walk on treadmill 60 minutes or outside daily. Stretching and push ups every morning  24-Hr Dietary Recall First Meal: oatmeal (plain), breakfast sandwich (bacon and egg), watermelon or blueberries or gold kiwi, hot tea Snack: sometimes junk food, like chips or  cookies Second Meal: doritos, watermelon or other fruit, beef jerky, two pieces of candy, bubblegum. Snack: cake Third Meal: chicken wings and salad Snack: doritos or chips Beverages: water, juice, hot tea, lemonade  Estimated Energy Needs Calories: 2000  NUTRITION DIAGNOSIS  NB-1.1 Food and nutrition-related knowledge deficit As related to knot knowing what to eat.  As evidenced by PCP referral and patient requesting education.  NUTRITION INTERVENTION  Nutrition education (E-1) on the following topics:   Re-iterated all education from last visit, adding in another goal. Fruits & Vegetables: Aim to fill half your plate with a variety of fruits and vegetables. They are rich in vitamins, minerals, and fiber, and can help reduce the risk of chronic diseases. Choose a colorful assortment of fruits and vegetables to ensure you get a wide range of nutrients. Grains and Starches: Make at least half of your grain choices whole grains, such as Aleczander Fandino rice, whole wheat bread, and oats. Whole grains provide fiber, which aids in digestion and healthy cholesterol levels. Aim for whole forms of starchy vegetables such as potatoes, sweet potatoes, beans, peas, and corn, which are fiber rich and provide many vitamins and minerals.  Protein: Incorporate lean sources of protein, such as poultry, fish, beans, nuts, and seeds, into your meals. Protein is essential for building and repairing tissues, staying full, balancing blood sugar, as well as supporting immune function. Dairy: Include low-fat or fat-free dairy products  like milk, yogurt, and cheese in your diet. Dairy foods are excellent sources of calcium and vitamin D, which are crucial for bone health.  Physical Activity: Aim for 150 minutes of physical activity weekly. Regular physical activity promotes overall health-including helping to reduce risk for heart disease and diabetes, promoting mental health, and helping [place] sleep better.  Why you need complex  carbohydrates: Whole grains and other complex carbohydrates are required to have a healthy diet. Whole grains provide fiber which can help with blood glucose levels and help keep you satiated. Fruits and starchy vegetables provide essential vitamins and minerals required for immune function, eyesight support, brain support, bone density, wound healing and many other functions within the body. According to the current evidenced based 2020-2025 Dietary Guidelines for Americans, complex carbohydrates are part of a healthy eating pattern which is associated with a decreased risk for type 2 diabetes, cancers, and cardiovascular disease.  Health Benefits of Physical Activity Encouraged patient to honor their body's internal hunger and fullness cues.  Throughout the day, check in mentally and rate hunger. Stop eating when satisfied not full regardless of how much food is left on the plate.  Get more if still hungry 20-30 minutes later.  The key is to honor satisfaction so throughout the meal, rate fullness factor and stop when comfortably satisfied not physically full. The key is to honor hunger and fullness without any feelings of guilt or shame.  Pay attention to what the internal cues are, rather than any external factors. This will enhance the confidence you have in listening to your own body and following those internal cues enabling you to increase how often you eat when you are hungry not out of appetite and stop when you are satisfied not full.  Encouraged pt to continue to eat balanced meals inclusive of non starchy vegetables 2 times a day 7 days a week Encouraged pt to choose lean protein sources: limiting beef, pork, sausage, hotdogs, and lunch meat Encourage pt to choose healthy fats such as plant based limiting animal fats Encouraged pt to continue to drink a minium 64 fluid ounces with half being plain water to satisfy proper hydration    Handouts Provided Include  Goals Printed Meal Ideas  Handout Body Comp Scale Readout  Learning Style & Readiness for Change Teaching method utilized: Visual & Auditory  Demonstrated degree of understanding via: Teach Back  Barriers to learning/adherence to lifestyle change: nothing identifired.  Goals Established by Pt Continue: increase complex carbohydrates and non-starchy starchy vegetables (2 or more servings); aim for 25 grams of fiber a day. Continue: physical activity, walking, stretching and push-up. New: aim to have a protein source with every meal or snack New: aim for satisfaction before fullness; slow down at meal time; aim for 20-30 minutes to eat  MONITORING & EVALUATION Dietary intake, weekly physical activity, body composition.  Next Steps  Patient is to return for follow-up in 3 months.

## 2023-09-20 ENCOUNTER — Ambulatory Visit (INDEPENDENT_AMBULATORY_CARE_PROVIDER_SITE_OTHER): Payer: No Typology Code available for payment source | Admitting: *Deleted

## 2023-09-20 ENCOUNTER — Encounter: Payer: Self-pay | Admitting: Internal Medicine

## 2023-09-20 DIAGNOSIS — J309 Allergic rhinitis, unspecified: Secondary | ICD-10-CM

## 2023-09-30 ENCOUNTER — Ambulatory Visit: Payer: No Typology Code available for payment source | Admitting: Internal Medicine

## 2023-10-04 ENCOUNTER — Encounter: Payer: Self-pay | Admitting: Internal Medicine

## 2023-10-04 ENCOUNTER — Other Ambulatory Visit: Payer: Self-pay

## 2023-10-04 ENCOUNTER — Ambulatory Visit (INDEPENDENT_AMBULATORY_CARE_PROVIDER_SITE_OTHER): Payer: No Typology Code available for payment source | Admitting: Internal Medicine

## 2023-10-04 VITALS — BP 116/86 | HR 57 | Temp 98.1°F | Resp 16 | Ht 71.0 in | Wt 219.9 lb

## 2023-10-04 DIAGNOSIS — J302 Other seasonal allergic rhinitis: Secondary | ICD-10-CM

## 2023-10-04 DIAGNOSIS — J3089 Other allergic rhinitis: Secondary | ICD-10-CM | POA: Diagnosis not present

## 2023-10-04 NOTE — Progress Notes (Signed)
 FOLLOW UP Date of Service/Encounter:  10/07/23  Subjective:  Steven Benson. (DOB: 11-13-1965) is a 58 y.o. male who [facility] in re-evaluation of the following: Allergic rhinitis on AIT History obtained from: chart review and patient.  For Review, LV was on 09/28/2022 with Thermon Leyland, FNP seen for routine follow-up. See below for summary of history and diagnostics.     Today presents for follow-up. Discussed the use of AI scribe software for clinical note transcription with the patient, who gave verbal consent to proceed.  History of Present Illness   Steven Benson. is a 58 year old male who presents for an annual visit regarding allergy shots.  He has been receiving allergy shots for approximately two years, with initial allergy testing conducted on October 11, 2020. The shots have been beneficial, and he reached the maintenance phase in October 2022. The anticipated earliest stop date with negative testing is October 2025.  He is currently taking Flonase, Astelin, montelukast, cetirizine, and two nasal sprays daily. Despite this regimen, he sometimes experiences congestion and post-nasal drip, particularly after sneezing. He uses sinus rinses but not consistently before his nasal sprays.  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 He works out of town frequently, which impacts his ability to consistently attend appointments.   He would like to know when he can stop allergy injections.         All medications reviewed by clinical staff and updated in chart. No new pertinent medical or surgical history except as noted in HPI.  ROS: All others negative except as noted per HPI.   Objective:  BP 116/86 (BP Location: Right Arm, Patient Position: Sitting, Cuff Size: Large)   Pulse (!) 57   Temp 98.1 F (36.7 C) (Temporal)   Resp 16   Ht 5\' 11"  (1.803 m)   Wt  219 lb 14.4 oz (99.7 kg)   SpO2 97%   BMI 30.67 kg/m  Body mass index is 30.67 kg/m. Physical Exam: General Appearance:  Alert, cooperative, no distress, appears stated age  Head:  Normocephalic, without obvious abnormality, atraumatic  Eyes:  Conjunctiva clear, EOM's intact  Ears EACs normal bilaterally  Nose: Nares normal, normal mucosa, no visible anterior polyps, and septum midline  Throat: Lips, tongue normal; teeth and gums normal, normal posterior oropharynx  Neck: Supple, symmetrical  Lungs:   clear to auscultation bilaterally, Respirations unlabored, no coughing  Heart:  regular rate and rhythm and no murmur, Appears well perfused  Extremities: No edema  Skin: Skin color, texture, turgor normal and no rashes or lesions on visualized portions of skin  Neurologic: No gross deficits   Labs:  Lab Orders  No laboratory test(s) ordered today      Assessment/Plan   1. Chronic rhinitis (grasses, ragweed, weeds, trees, indoor molds, outdoor molds, cat and dog) - Continue with allergen immunotherapy at the same schedule and have access to an epinephrine auto-injector set - Continue taking: Flonase two sprays per nostril up to twice daily - Continue taking: Astelin (azelastine) 2 sprays per nostril 1-2 times daily as needed - Continue taking: cetirizine 10mg  up to twice daily - You can use an extra dose of the antihistamine, if needed, for breakthrough symptoms.  - Consider nasal saline rinses 1-2 times daily to remove allergens from the nasal cavities as well as help with mucous clearance (  this is especially helpful to do before  10-15 minutes prior the nasal sprays are given)  2. Follow up around 10/25 for updated allergy testing as you will have completed 3 years of allergy injections and this is the earliest stopping point.   Other: allergy injection given in clinic today  Thank you so much for letting me partake in your care today.  Don't hesitate to reach out if you have  any additional concerns!  Ferol Luz, MD  [facility] [place], [place]

## 2023-10-04 NOTE — Patient Instructions (Addendum)
 1. Chronic rhinitis (grasses, ragweed, weeds, trees, indoor molds, outdoor molds, cat and dog) - Continue with allergen immunotherapy at the same schedule and have access to an epinephrine auto-injector set - Continue taking: Flonase two sprays per nostril up to twice daily - Continue taking: Astelin (azelastine) 2 sprays per nostril 1-2 times daily as needed - Continue taking: cetirizine 10mg  up to twice daily - You can use an extra dose of the antihistamine, if needed, for breakthrough symptoms.  - Consider nasal saline rinses 1-2 times daily to remove allergens from the nasal cavities as well as help with mucous clearance (this is especially helpful to do before  10-15 minutes prior the nasal sprays are given)  2. Follow up around 10/25 for updated allergy testing as you will have completed 3 years of allergy injections and this is the earliest stopping point.   Thank you so much for letting me partake in your care today.  Don't hesitate to reach out if you have any additional concerns!  Ferol Luz, MD  [facility] [place], [place]

## 2023-10-07 MED ORDER — EPINEPHRINE 0.3 MG/0.3ML IJ SOAJ: 0.3000 mg | INTRAMUSCULAR | 1 refills | Status: AC | PRN

## 2023-10-07 MED ORDER — AZELASTINE HCL 0.1 % NA SOLN: NASAL | 5 refills | Status: AC

## 2023-10-07 MED ORDER — FLUTICASONE PROPIONATE 50 MCG/ACT NA SUSP: 2.0000 | Freq: Two times a day (BID) | NASAL | 5 refills | Status: AC

## 2023-10-18 ENCOUNTER — Encounter: Payer: Self-pay | Admitting: Allergy

## 2023-10-18 ENCOUNTER — Ambulatory Visit (INDEPENDENT_AMBULATORY_CARE_PROVIDER_SITE_OTHER): Payer: Self-pay | Admitting: *Deleted

## 2023-10-18 DIAGNOSIS — J309 Allergic rhinitis, unspecified: Secondary | ICD-10-CM | POA: Diagnosis not present

## 2023-11-15 ENCOUNTER — Encounter: Payer: Self-pay | Admitting: Internal Medicine

## 2023-11-15 ENCOUNTER — Ambulatory Visit (INDEPENDENT_AMBULATORY_CARE_PROVIDER_SITE_OTHER)

## 2023-11-15 DIAGNOSIS — J309 Allergic rhinitis, unspecified: Secondary | ICD-10-CM

## 2023-11-29 ENCOUNTER — Ambulatory Visit: Payer: 59 | Admitting: Dietician

## 2023-12-09 DIAGNOSIS — J3081 Allergic rhinitis due to animal (cat) (dog) hair and dander: Secondary | ICD-10-CM | POA: Diagnosis not present

## 2023-12-09 NOTE — Progress Notes (Signed)
 VIALS MADE 12-09-23. EXP 12-08-24

## 2023-12-10 DIAGNOSIS — J302 Other seasonal allergic rhinitis: Secondary | ICD-10-CM | POA: Diagnosis not present

## 2023-12-20 ENCOUNTER — Encounter: Payer: Self-pay | Admitting: Allergy

## 2023-12-20 ENCOUNTER — Ambulatory Visit (INDEPENDENT_AMBULATORY_CARE_PROVIDER_SITE_OTHER)

## 2023-12-20 DIAGNOSIS — J309 Allergic rhinitis, unspecified: Secondary | ICD-10-CM | POA: Diagnosis not present

## 2024-01-17 ENCOUNTER — Ambulatory Visit (INDEPENDENT_AMBULATORY_CARE_PROVIDER_SITE_OTHER): Payer: Self-pay

## 2024-01-17 ENCOUNTER — Encounter: Payer: Self-pay | Admitting: Internal Medicine

## 2024-01-17 DIAGNOSIS — J309 Allergic rhinitis, unspecified: Secondary | ICD-10-CM | POA: Diagnosis not present

## 2024-02-13 ENCOUNTER — Encounter: Payer: Self-pay | Admitting: Allergy & Immunology

## 2024-02-13 ENCOUNTER — Ambulatory Visit (INDEPENDENT_AMBULATORY_CARE_PROVIDER_SITE_OTHER)

## 2024-02-13 DIAGNOSIS — J309 Allergic rhinitis, unspecified: Secondary | ICD-10-CM | POA: Diagnosis not present

## 2024-03-12 ENCOUNTER — Encounter: Payer: Self-pay | Admitting: Allergy & Immunology

## 2024-03-12 ENCOUNTER — Ambulatory Visit (INDEPENDENT_AMBULATORY_CARE_PROVIDER_SITE_OTHER)

## 2024-03-12 DIAGNOSIS — J309 Allergic rhinitis, unspecified: Secondary | ICD-10-CM

## 2024-03-18 ENCOUNTER — Other Ambulatory Visit: Payer: Self-pay | Admitting: Internal Medicine

## 2024-03-18 DIAGNOSIS — Z1231 Encounter for screening mammogram for malignant neoplasm of breast: Secondary | ICD-10-CM

## 2024-03-20 ENCOUNTER — Ambulatory Visit

## 2024-03-20 ENCOUNTER — Encounter: Payer: Self-pay | Admitting: Internal Medicine

## 2024-03-20 DIAGNOSIS — J309 Allergic rhinitis, unspecified: Secondary | ICD-10-CM | POA: Diagnosis not present

## 2024-03-27 ENCOUNTER — Encounter: Payer: Self-pay | Admitting: Allergy

## 2024-03-27 ENCOUNTER — Ambulatory Visit (INDEPENDENT_AMBULATORY_CARE_PROVIDER_SITE_OTHER): Admitting: *Deleted

## 2024-03-27 DIAGNOSIS — J309 Allergic rhinitis, unspecified: Secondary | ICD-10-CM | POA: Diagnosis not present

## 2024-04-24 ENCOUNTER — Ambulatory Visit (INDEPENDENT_AMBULATORY_CARE_PROVIDER_SITE_OTHER)

## 2024-04-24 ENCOUNTER — Encounter: Payer: Self-pay | Admitting: Internal Medicine

## 2024-04-24 DIAGNOSIS — J309 Allergic rhinitis, unspecified: Secondary | ICD-10-CM | POA: Diagnosis not present

## 2024-05-22 ENCOUNTER — Ambulatory Visit

## 2024-05-22 ENCOUNTER — Encounter: Payer: Self-pay | Admitting: Internal Medicine

## 2024-05-22 DIAGNOSIS — J309 Allergic rhinitis, unspecified: Secondary | ICD-10-CM

## 2024-06-12 ENCOUNTER — Encounter: Payer: Self-pay | Admitting: Internal Medicine

## 2024-06-12 ENCOUNTER — Ambulatory Visit (INDEPENDENT_AMBULATORY_CARE_PROVIDER_SITE_OTHER): Payer: No Typology Code available for payment source | Admitting: Internal Medicine

## 2024-06-12 DIAGNOSIS — J302 Other seasonal allergic rhinitis: Secondary | ICD-10-CM

## 2024-06-12 DIAGNOSIS — J3089 Other allergic rhinitis: Secondary | ICD-10-CM | POA: Diagnosis not present

## 2024-06-12 NOTE — Progress Notes (Signed)
 Date of Service/Encounter:  06/12/24  Allergy  testing appointment    Today reports for allergy  diagnostic testing:    DIAGNOSTICS:  Skin Testing: Environmental allergy  panel. Adequate positive and negative controls Results discussed with patient/family.  Airborne Adult Perc - 06/12/24 0939     Time Antigen Placed 9060    Allergen Manufacturer Jestine    Location Back    Number of Test 55    1. Control-Buffer 50% Glycerol Negative    2. Control-Histamine 3+    3. Bahia Negative    4. Bermuda Negative    5. Johnson Negative    6. [place]  Blue Negative    7. Meadow Fescue Negative    8. Perennial Rye 3+    9. Timothy 3+    10. Ragweed Mix Negative    11. Cocklebur Negative    12. Plantain,  English Negative    13. Baccharis Negative    14. Dog Fennel Negative    15. Russian Thistle Negative    16. Lamb's Quarters Negative    17. Sheep Sorrell Negative    18. Rough Pigweed Negative    19. Marsh Elder, Rough Negative    20. Mugwort, Common Negative    21. Box, Elder Negative    22. Cedar, red Negative    23. Sweet Gum Negative    24. Pecan Pollen 3+    25. Pine Mix Negative    26. Walnut, Black Pollen 2+    27. Red Mulberry Negative    28. Ash Mix Negative    29. Birch Mix Negative    30. Beech American Negative    31. Cottonwood, Eastern Negative    32. Hickory, White 2+    33. Maple Mix Negative    34. Oak, Eastern Mix Negative    35. Sycamore Eastern Negative    36. Alternaria Alternata Negative    37. Cladosporium Herbarum Negative    38. Aspergillus Mix Negative    39. Penicillium Mix Negative    40. Bipolaris Sorokiniana (Helminthosporium) Negative    41. Drechslera Spicifera (Curvularia) Negative    42. Mucor Plumbeus Negative    43. Fusarium Moniliforme Negative    44. Aureobasidium Pullulans (pullulara) Negative    45. Rhizopus Oryzae Negative    46. Botrytis Cinera Negative    47. Epicoccum Nigrum Negative    48. Phoma Betae Negative    49.  Dust Mite Mix Negative    50. Cat Hair 10,000 BAU/ml Negative    51.  Dog Epithelia Negative    52. Mixed Feathers Negative    53. Horse Epithelia Negative    54. Cockroach, German Negative    55. Tobacco Leaf Negative          Intradermal - 06/12/24 1009     Time Antigen Placed 1009    Allergen Manufacturer Jestine    Location Arm    Number of Test 14    Control Negative    Bahia 2+    Bermuda 2+    Johnson 2+    Ragweed Mix Negative    Weed Mix Negative    Mold 1 Negative    Mold 2 Negative    Mold 3 Negative    Mold 4 Negative    Mite Mix Negative    Cat Negative    Dog Negative    Cockroach Negative          Allergy  testing results were read and interpreted by myself, documented by clinical staff.  Will continue allergy  injections for another 2 years   Patient provided with copy of allergy  testing along with avoidance measures when indicated.   Hargis Springer, MD  [facility] [place] 

## 2024-06-26 ENCOUNTER — Ambulatory Visit (INDEPENDENT_AMBULATORY_CARE_PROVIDER_SITE_OTHER): Admitting: *Deleted

## 2024-06-26 ENCOUNTER — Encounter: Payer: Self-pay | Admitting: Internal Medicine

## 2024-06-26 DIAGNOSIS — J309 Allergic rhinitis, unspecified: Secondary | ICD-10-CM

## 2024-07-23 ENCOUNTER — Ambulatory Visit (INDEPENDENT_AMBULATORY_CARE_PROVIDER_SITE_OTHER): Admitting: *Deleted

## 2024-07-23 ENCOUNTER — Encounter: Payer: Self-pay | Admitting: Allergy

## 2024-07-23 DIAGNOSIS — J309 Allergic rhinitis, unspecified: Secondary | ICD-10-CM | POA: Diagnosis not present

## 2024-08-21 ENCOUNTER — Ambulatory Visit: Admitting: *Deleted

## 2024-08-21 ENCOUNTER — Encounter: Payer: Self-pay | Admitting: Allergy

## 2024-08-21 DIAGNOSIS — J302 Other seasonal allergic rhinitis: Secondary | ICD-10-CM

## 2024-09-09 NOTE — Progress Notes (Signed)
 VIALS MADE ON 09/09/24
